# Patient Record
Sex: Female | Born: 1980 | Race: White | Hispanic: No | Smoking: Current every day smoker
Health system: Southern US, Community
[De-identification: ages and names within clinical notes are randomized; demographics above are authoritative.]

## PROBLEM LIST (undated history)

## (undated) DIAGNOSIS — R51 Headache: Secondary | ICD-10-CM

## (undated) DIAGNOSIS — F419 Anxiety disorder, unspecified: Secondary | ICD-10-CM

## (undated) DIAGNOSIS — M797 Fibromyalgia: Secondary | ICD-10-CM

## (undated) DIAGNOSIS — R519 Headache, unspecified: Secondary | ICD-10-CM

## (undated) DIAGNOSIS — Z87442 Personal history of urinary calculi: Secondary | ICD-10-CM

## (undated) DIAGNOSIS — I10 Essential (primary) hypertension: Secondary | ICD-10-CM

## (undated) DIAGNOSIS — E119 Type 2 diabetes mellitus without complications: Secondary | ICD-10-CM

## (undated) DIAGNOSIS — K219 Gastro-esophageal reflux disease without esophagitis: Secondary | ICD-10-CM

## (undated) DIAGNOSIS — Z8709 Personal history of other diseases of the respiratory system: Secondary | ICD-10-CM

## (undated) DIAGNOSIS — E785 Hyperlipidemia, unspecified: Secondary | ICD-10-CM

## (undated) HISTORY — PX: WISDOM TOOTH EXTRACTION: SHX21

---

## 2001-08-27 ENCOUNTER — Emergency Department (HOSPITAL_COMMUNITY): Admission: EM | Admit: 2001-08-27 | Discharge: 2001-08-28 | Payer: Self-pay | Admitting: *Deleted

## 2001-12-15 ENCOUNTER — Emergency Department (HOSPITAL_COMMUNITY): Admission: EM | Admit: 2001-12-15 | Discharge: 2001-12-15 | Payer: Self-pay | Admitting: Emergency Medicine

## 2002-03-28 ENCOUNTER — Ambulatory Visit (HOSPITAL_COMMUNITY): Admission: RE | Admit: 2002-03-28 | Discharge: 2002-03-28 | Payer: Self-pay | Admitting: *Deleted

## 2002-03-28 ENCOUNTER — Encounter: Payer: Self-pay | Admitting: *Deleted

## 2002-05-12 ENCOUNTER — Ambulatory Visit (HOSPITAL_COMMUNITY): Admission: AD | Admit: 2002-05-12 | Discharge: 2002-05-12 | Payer: Self-pay | Admitting: *Deleted

## 2002-06-13 ENCOUNTER — Encounter: Payer: Self-pay | Admitting: *Deleted

## 2002-06-13 ENCOUNTER — Ambulatory Visit (HOSPITAL_COMMUNITY): Admission: RE | Admit: 2002-06-13 | Discharge: 2002-06-13 | Payer: Self-pay | Admitting: *Deleted

## 2002-07-01 ENCOUNTER — Ambulatory Visit (HOSPITAL_COMMUNITY): Admission: AD | Admit: 2002-07-01 | Discharge: 2002-07-01 | Payer: Self-pay | Admitting: *Deleted

## 2002-07-16 ENCOUNTER — Ambulatory Visit (HOSPITAL_COMMUNITY): Admission: AD | Admit: 2002-07-16 | Discharge: 2002-07-16 | Payer: Self-pay | Admitting: *Deleted

## 2002-07-21 ENCOUNTER — Ambulatory Visit (HOSPITAL_COMMUNITY): Admission: RE | Admit: 2002-07-21 | Discharge: 2002-07-21 | Payer: Self-pay | Admitting: *Deleted

## 2002-07-25 ENCOUNTER — Inpatient Hospital Stay (HOSPITAL_COMMUNITY): Admission: AD | Admit: 2002-07-25 | Discharge: 2002-07-27 | Payer: Self-pay | Admitting: *Deleted

## 2002-08-30 ENCOUNTER — Ambulatory Visit (HOSPITAL_COMMUNITY): Admission: RE | Admit: 2002-08-30 | Discharge: 2002-08-30 | Payer: Self-pay | Admitting: *Deleted

## 2002-11-05 ENCOUNTER — Other Ambulatory Visit: Admission: RE | Admit: 2002-11-05 | Discharge: 2002-11-05 | Payer: Self-pay | Admitting: *Deleted

## 2003-07-17 ENCOUNTER — Ambulatory Visit (HOSPITAL_COMMUNITY): Admission: RE | Admit: 2003-07-17 | Discharge: 2003-07-17 | Payer: Self-pay | Admitting: Family Medicine

## 2003-07-17 ENCOUNTER — Encounter: Payer: Self-pay | Admitting: Family Medicine

## 2004-01-22 ENCOUNTER — Ambulatory Visit (HOSPITAL_COMMUNITY): Admission: RE | Admit: 2004-01-22 | Discharge: 2004-01-22 | Payer: Self-pay | Admitting: *Deleted

## 2004-02-09 ENCOUNTER — Other Ambulatory Visit: Admission: RE | Admit: 2004-02-09 | Discharge: 2004-02-09 | Payer: Self-pay | Admitting: *Deleted

## 2004-07-02 ENCOUNTER — Ambulatory Visit (HOSPITAL_COMMUNITY): Admission: RE | Admit: 2004-07-02 | Discharge: 2004-07-02 | Payer: Self-pay | Admitting: Family Medicine

## 2004-11-22 ENCOUNTER — Emergency Department (HOSPITAL_COMMUNITY): Admission: EM | Admit: 2004-11-22 | Discharge: 2004-11-22 | Payer: Self-pay | Admitting: Emergency Medicine

## 2005-03-07 ENCOUNTER — Emergency Department (HOSPITAL_COMMUNITY): Admission: EM | Admit: 2005-03-07 | Discharge: 2005-03-07 | Payer: Self-pay | Admitting: Emergency Medicine

## 2005-05-05 ENCOUNTER — Emergency Department (HOSPITAL_COMMUNITY): Admission: EM | Admit: 2005-05-05 | Discharge: 2005-05-05 | Payer: Self-pay | Admitting: Emergency Medicine

## 2005-06-06 ENCOUNTER — Emergency Department (HOSPITAL_COMMUNITY): Admission: EM | Admit: 2005-06-06 | Discharge: 2005-06-06 | Payer: Self-pay | Admitting: Emergency Medicine

## 2005-09-25 ENCOUNTER — Emergency Department (HOSPITAL_COMMUNITY): Admission: EM | Admit: 2005-09-25 | Discharge: 2005-09-26 | Payer: Self-pay | Admitting: *Deleted

## 2006-06-13 ENCOUNTER — Emergency Department (HOSPITAL_COMMUNITY): Admission: EM | Admit: 2006-06-13 | Discharge: 2006-06-13 | Payer: Self-pay | Admitting: Emergency Medicine

## 2006-06-26 ENCOUNTER — Emergency Department (HOSPITAL_COMMUNITY): Admission: EM | Admit: 2006-06-26 | Discharge: 2006-06-27 | Payer: Self-pay | Admitting: Emergency Medicine

## 2006-09-05 ENCOUNTER — Emergency Department (HOSPITAL_COMMUNITY): Admission: EM | Admit: 2006-09-05 | Discharge: 2006-09-05 | Payer: Self-pay | Admitting: Emergency Medicine

## 2006-10-08 ENCOUNTER — Emergency Department (HOSPITAL_COMMUNITY): Admission: EM | Admit: 2006-10-08 | Discharge: 2006-10-08 | Payer: Self-pay | Admitting: Emergency Medicine

## 2006-10-29 ENCOUNTER — Emergency Department (HOSPITAL_COMMUNITY): Admission: EM | Admit: 2006-10-29 | Discharge: 2006-10-29 | Payer: Self-pay | Admitting: Emergency Medicine

## 2007-06-15 ENCOUNTER — Emergency Department (HOSPITAL_COMMUNITY): Admission: EM | Admit: 2007-06-15 | Discharge: 2007-06-16 | Payer: Self-pay | Admitting: *Deleted

## 2007-07-27 ENCOUNTER — Ambulatory Visit (HOSPITAL_COMMUNITY): Admission: RE | Admit: 2007-07-27 | Discharge: 2007-07-27 | Payer: Self-pay | Admitting: Family Medicine

## 2008-01-17 ENCOUNTER — Ambulatory Visit: Payer: Self-pay | Admitting: Orthopedic Surgery

## 2008-01-17 DIAGNOSIS — M549 Dorsalgia, unspecified: Secondary | ICD-10-CM | POA: Insufficient documentation

## 2008-01-18 ENCOUNTER — Telehealth: Payer: Self-pay | Admitting: Orthopedic Surgery

## 2008-02-06 ENCOUNTER — Emergency Department (HOSPITAL_COMMUNITY): Admission: EM | Admit: 2008-02-06 | Discharge: 2008-02-06 | Payer: Self-pay | Admitting: Emergency Medicine

## 2008-03-17 ENCOUNTER — Emergency Department (HOSPITAL_COMMUNITY): Admission: EM | Admit: 2008-03-17 | Discharge: 2008-03-17 | Payer: Self-pay | Admitting: Emergency Medicine

## 2008-04-18 ENCOUNTER — Emergency Department (HOSPITAL_COMMUNITY): Admission: EM | Admit: 2008-04-18 | Discharge: 2008-04-18 | Payer: Self-pay | Admitting: Emergency Medicine

## 2008-06-10 ENCOUNTER — Ambulatory Visit (HOSPITAL_COMMUNITY): Admission: RE | Admit: 2008-06-10 | Discharge: 2008-06-10 | Payer: Self-pay | Admitting: Family Medicine

## 2008-06-13 ENCOUNTER — Encounter (INDEPENDENT_AMBULATORY_CARE_PROVIDER_SITE_OTHER): Payer: Self-pay | Admitting: Family Medicine

## 2008-06-13 ENCOUNTER — Ambulatory Visit (HOSPITAL_COMMUNITY): Admission: RE | Admit: 2008-06-13 | Discharge: 2008-06-13 | Payer: Self-pay | Admitting: Family Medicine

## 2008-07-05 ENCOUNTER — Emergency Department (HOSPITAL_COMMUNITY): Admission: EM | Admit: 2008-07-05 | Discharge: 2008-07-05 | Payer: Self-pay | Admitting: Emergency Medicine

## 2008-08-12 ENCOUNTER — Emergency Department (HOSPITAL_COMMUNITY): Admission: EM | Admit: 2008-08-12 | Discharge: 2008-08-12 | Payer: Self-pay | Admitting: Emergency Medicine

## 2008-09-01 ENCOUNTER — Emergency Department (HOSPITAL_COMMUNITY): Admission: EM | Admit: 2008-09-01 | Discharge: 2008-09-01 | Payer: Self-pay | Admitting: Emergency Medicine

## 2008-09-25 ENCOUNTER — Emergency Department (HOSPITAL_COMMUNITY): Admission: EM | Admit: 2008-09-25 | Discharge: 2008-09-25 | Payer: Self-pay | Admitting: Emergency Medicine

## 2008-10-10 ENCOUNTER — Emergency Department (HOSPITAL_COMMUNITY): Admission: EM | Admit: 2008-10-10 | Discharge: 2008-10-10 | Payer: Self-pay | Admitting: Emergency Medicine

## 2009-01-27 ENCOUNTER — Emergency Department (HOSPITAL_COMMUNITY): Admission: EM | Admit: 2009-01-27 | Discharge: 2009-01-27 | Payer: Self-pay | Admitting: Emergency Medicine

## 2009-01-30 ENCOUNTER — Emergency Department (HOSPITAL_COMMUNITY): Admission: EM | Admit: 2009-01-30 | Discharge: 2009-01-30 | Payer: Self-pay | Admitting: Emergency Medicine

## 2009-05-19 ENCOUNTER — Emergency Department (HOSPITAL_COMMUNITY): Admission: EM | Admit: 2009-05-19 | Discharge: 2009-05-19 | Payer: Self-pay | Admitting: Emergency Medicine

## 2009-09-23 ENCOUNTER — Emergency Department (HOSPITAL_COMMUNITY): Admission: EM | Admit: 2009-09-23 | Discharge: 2009-09-23 | Payer: Self-pay | Admitting: Emergency Medicine

## 2010-02-02 ENCOUNTER — Emergency Department (HOSPITAL_COMMUNITY): Admission: EM | Admit: 2010-02-02 | Discharge: 2010-02-02 | Payer: Self-pay | Admitting: Emergency Medicine

## 2010-03-11 ENCOUNTER — Emergency Department (HOSPITAL_COMMUNITY): Admission: EM | Admit: 2010-03-11 | Discharge: 2010-03-11 | Payer: Self-pay | Admitting: Emergency Medicine

## 2010-05-02 ENCOUNTER — Emergency Department (HOSPITAL_COMMUNITY): Admission: EM | Admit: 2010-05-02 | Discharge: 2010-05-02 | Payer: Self-pay | Admitting: Emergency Medicine

## 2010-06-10 ENCOUNTER — Encounter: Payer: Self-pay | Admitting: Orthopedic Surgery

## 2010-06-29 ENCOUNTER — Emergency Department (HOSPITAL_COMMUNITY): Admission: EM | Admit: 2010-06-29 | Discharge: 2010-06-30 | Payer: Self-pay | Admitting: Emergency Medicine

## 2010-07-07 ENCOUNTER — Encounter: Payer: Self-pay | Admitting: Orthopedic Surgery

## 2010-07-07 ENCOUNTER — Telehealth: Payer: Self-pay | Admitting: Orthopedic Surgery

## 2010-09-15 ENCOUNTER — Emergency Department (HOSPITAL_COMMUNITY): Admission: EM | Admit: 2010-09-15 | Discharge: 2010-09-16 | Payer: Self-pay | Admitting: Emergency Medicine

## 2010-09-18 ENCOUNTER — Emergency Department (HOSPITAL_COMMUNITY): Admission: EM | Admit: 2010-09-18 | Discharge: 2010-09-18 | Payer: Self-pay | Admitting: Emergency Medicine

## 2010-10-17 ENCOUNTER — Emergency Department (HOSPITAL_COMMUNITY): Admission: EM | Admit: 2010-10-17 | Discharge: 2010-10-17 | Payer: Self-pay | Admitting: Emergency Medicine

## 2010-11-15 ENCOUNTER — Emergency Department (HOSPITAL_COMMUNITY)
Admission: EM | Admit: 2010-11-15 | Discharge: 2010-11-15 | Payer: Self-pay | Source: Home / Self Care | Admitting: Emergency Medicine

## 2010-11-27 ENCOUNTER — Emergency Department (HOSPITAL_COMMUNITY)
Admission: EM | Admit: 2010-11-27 | Discharge: 2010-11-27 | Payer: Self-pay | Source: Home / Self Care | Admitting: Emergency Medicine

## 2010-12-19 ENCOUNTER — Encounter: Payer: Self-pay | Admitting: Orthopedic Surgery

## 2010-12-26 ENCOUNTER — Emergency Department (HOSPITAL_COMMUNITY)
Admission: EM | Admit: 2010-12-26 | Discharge: 2010-12-26 | Payer: Self-pay | Source: Home / Self Care | Admitting: Emergency Medicine

## 2010-12-28 NOTE — Letter (Signed)
Summary: [facility] Referral No Show  [facility]  [address]  [place], [place] [address]   Phone: [phone]  Fax: [phone]    06/10/10   [facility]  ATTN:  Referrals Phone [phone] Fax   [phone]  Re:    Caitlin Mcguire DOB:    November 11, 1981    The above named patient was a no show for the scheduled appointment:  Date:  7/07;/11 Time:  8:45 am  Thank you for your kind referral.  Please feel free to contact our office if we can be of further service.  Sincerely,   [facility] Office of Dr. Terrance Mass, MD

## 2010-12-28 NOTE — Letter (Signed)
Summary: Letter to PCP office [facility] Dept Referrals  [facility]  [address]  [place], [place] [address]   Phone: [phone]  Fax: [phone]      07/07/2010  [facility] Department Attn:  Referral Department Ph:  [phone] Fax: [phone]  RE: Caitlin Mcguire [address] [place], [place]  [address]   Regarding the re-scheduled appointment date of 07/07/10 at 11:45am for Caitlin Mcguire, Dr Romeo Apple had reviewed the medical records and has recommended that she hold on the appointment until the bone scan and the physical therapy that he recommended at her last visit in 2009 be followed.  We have advised the patient of this information and will ask that she follow up with your office for these to be ordered there, as insurance will not pre-authorize from our last office notes from 2009.  Please contact our office if any questions or if we may be of further assistance.  Once patient has had the bone scan and the physical therapy, we would be happy to schedule another appointment.  Sincerely,   [facility] Terrance Mass, MD

## 2010-12-28 NOTE — Letter (Signed)
Summary: *Orthopedic No Show Letter  [facility]  [address]  [place], [place] [address]   Phone: [phone]  Fax: [phone]      06/10/2010   Da Schnitzler [address] [place], [place] [address]    Dear Ms. Ganci,   Our records indicate that you missed your scheduled appointment with Dr. Beaulah Corin on 06/03/10.  Please contact this office to reschedule your appointment as soon as possible.  It is important that you keep your scheduled appointments with your physician, so we can provide you the best care possible.        Sincerely,    Dr. Terrance Mass, MD [facility] Phone [phone]

## 2010-12-28 NOTE — Progress Notes (Signed)
Summary: Call to patient,hold on appt until bone scan R/s + PT  ---- Converted from flag ---- ---- 07/07/2010 8:25 AM, Fuller Canada MD wrote: please call patient cancel appointment the patient did not go for the bone scan and did not have therapy after I saw her the last time if she wants to go ahead and do that and she still has problems after that therapy then we will be happy to see her otherwise no appointment  Also if she wants to do she can have the bone scan rescheduled  07/07/10 Called patient at [facility] # [phone]; left msg 8:47Am.  Patient ret'd call and I have advised as above.    She would like to re-schedule the bone scan and will proceed with physical therapy.  New orders needed.  ------------------------------  Phone Note Call from Patient   Summary of Call: patient will have to be seen somewhere else, she has not been seen in over 2 years and the dr does not want to see her, for the precert for the bone scan she has to be seen by someone recent Initial call taken by: Chasity Tereasa Coop,  July 07, 2010 9:15 AM  Follow-up for Phone Call        I am following up with the referral coordinator at [facility], as they ref'd patient here, as they would need to order the testing and the therapy. Faxed notes to Health dept [phone] w/this request. Follow-up by: Cammie Sickle,  July 07, 2010 2:48 PM

## 2011-01-20 ENCOUNTER — Emergency Department (HOSPITAL_COMMUNITY)
Admission: EM | Admit: 2011-01-20 | Discharge: 2011-01-20 | Disposition: A | Discharge status: Home / Self Care | Payer: Self-pay | Attending: Emergency Medicine | Admitting: Emergency Medicine

## 2011-01-20 DIAGNOSIS — L0501 Pilonidal cyst with abscess: Secondary | ICD-10-CM | POA: Insufficient documentation

## 2011-02-07 LAB — URINE CULTURE: Colony Count: 80000

## 2011-02-07 LAB — URINALYSIS, ROUTINE W REFLEX MICROSCOPIC
Ketones, ur: NEGATIVE mg/dL
Nitrite: NEGATIVE
Protein, ur: NEGATIVE mg/dL
Specific Gravity, Urine: 1.02 (ref 1.005–1.030)

## 2011-02-07 LAB — URINE MICROSCOPIC-ADD ON

## 2011-03-10 LAB — URINALYSIS, ROUTINE W REFLEX MICROSCOPIC
Bilirubin Urine: NEGATIVE
Glucose, UA: NEGATIVE mg/dL
Hgb urine dipstick: NEGATIVE
Protein, ur: NEGATIVE mg/dL
Specific Gravity, Urine: 1.03 (ref 1.005–1.030)
pH: 5.5 (ref 5.0–8.0)

## 2011-04-15 NOTE — H&P (Signed)
NAME:  Caitlin Mcguire, ORD NO.:  [id number]   MEDICAL RECORD NO.:  [id number]                   PATIENT TYPE:   LOCATION:                                       FACILITY:   PHYSICIAN:  Roylene Reason. Lisette Grinder, M.D.             DATE OF BIRTH:   DATE OF ADMISSION:  07/25/2002  DATE OF DISCHARGE:                                HISTORY & PHYSICAL   HISTORY OF PRESENT ILLNESS:  A 30 year old gravida 3, para 1 at [redacted] weeks  gestation admitted for induction of labor.  Prenatal course uncomplicated.  The patient is known to have an elevated one-hour GTT of 165; however, the  patient had drank juice during that day.  She has no risk factors for  gestational diabetes.  During her previous pregnancy, one-hour GTT was  normal.  The patient has had no glycosuria and no excessive weight gain  during the pregnancy.  The patient is noted to be A+ blood type.  She has  had serial ultrasounds that documented adequate fetal growth and normal  anatomic survey.  She has an absolutely certain last menstrual period which  places her at [redacted] weeks gestation and this is supported by a 19-week  ultrasound with a plus or minus 10-day window of accuracy.   PAST MEDICAL HISTORY:  November 1998:  Vacuum extraction, 7 pound 4 ounce  female infant, [redacted] weeks gestation, episiotomy plus epidural.  December 2000:  Spontaneous AB.   ALLERGIES:  PENICILLIN.   CURRENT MEDICATIONS:  1. Prenatal vitamins.  2. Lortab p.r.n. for back pain.   PHYSICAL EXAMINATION:  VITAL SIGNS:  Height 5 feet 2 inches, prepregnancy  weight 203 pounds, blood pressure 108/68, pulse 80, respiratory rate 20.  HEENT:  Normal.  LUNGS:  Clear.  CARDIOVASCULAR:  Regular rate and rhythm.  ABDOMEN:  Soft and nontender.  Vertex presentation.  No surgical scars.  A  40-cm fundal height.  EXTREMITIES:  Normal.  PELVIC:  Normal external genitalia.  Pelvis clinically adequate.  Cervix 3,  70, 0, vertex, intact bulging  membranes.   ASSESSMENT:  A 38-week intrauterine pregnancy, significant complaints of  back pain during this pregnancy, particularly worsening at this time.  The  patient is known to be having irregular uterine contractions at this time  and a very favorable cervix.  Thus, the patient has been admitted for  induction of labor on July 25, 2002.  Pediatrician had planned on  utilizing Dr. Gabriel Rung.  She will be utilizing pediatrician on-call.  She is  undecided regarding bottle or breastfeeding.  At some point in the near  future, she would desire permanent sterilization, but this will not be done  during this hospitalization, as her Medicaid 30-day waiting papers have only  been signed on July 23, 2002.  Donald P. Lisette Grinder, M.D.   DPC/MEDQ  D:  07/23/2002  T:  07/24/2002  Job:  [id number]

## 2011-04-15 NOTE — Op Note (Signed)
   NAME:  Caitlin Mcguire, Caitlin Mcguire                          ACCOUNT NO.:  [id number]   MEDICAL RECORD NO.:  [id number]                   PATIENT TYPE:  INP   LOCATION:  A427                                 FACILITY:  [facility]   PHYSICIAN:  Roylene Reason. Lisette Grinder, M.D.             DATE OF BIRTH:  [birth year]   DATE OF PROCEDURE:  DATE OF DISCHARGE:  07/27/2002                                 OPERATIVE REPORT   PROCEDURE:  Placement of continuous lumbar epidural analgesia at the L3-4  interspace performed by Dr. Roylene Reason. Lisette Grinder.   COMPLICATIONS:  None.   SPECIMENS:  None.   SUMMARY:  Appropriate informed consent was obtained. Continuous electronic  fetal monitoring was performed. The patient was placed in the seated  position at which time bony landmarks were identified, the L3-4 interspace  was chosen. The patient's back was sterilely prepped utilizing an epidural  kit, 5 cc 1% lidocaine injected at the midline of the L3-4 interspace just  to raise the small skin wheal. The 17 gauge Tuohy-Schliff needle is then  utilized with loss of resistance in an air filled glass syringe to identify  entry into the epidural space on the first attempt without difficulty.  Initial test dose of 5 cc 1.5% lidocaine plus epinephrine was injected  through the epidural needle with no signs of CSF or intravascular injection  obtained, thus the catheter is inserted to a depth of 4 cm, epidural needle  is removed, aspiration test is negative. The epidural catheter is then  inserted to a depth of 4 cm, epidural needle is removed, aspiration test is  negative. A second test dose of 2 cc 1.5% lidocaine plus epinephrine is  injected through the epidural catheter. No signs of CSF or intravascular  injection obtained. The patient is having tingling in the feet consistent  with properly setting of epidural block, thus the catheter secured into  place. The patient is connected to the infusion pump containing a standard  mixture.  She will be treated with a bolus of 10 cc followed thereafter by  continuous infusion rate of 14 cc/hour. The patient is then returned to the  left lateral supine position at which time she has heaviness in both legs  consistent with a properly setting up epidural block. The patient to be  maintained on the infusion pump at 14 cc/hour. There were no complications.  Fetal heart rate remains reassuring. Blood pressure remains stable.                                               Donald P. Lisette Grinder, M.D.    DPC/MEDQ  D:  07/26/2002  T:  07/27/2002  Job:  [id number]

## 2011-04-15 NOTE — Discharge Summary (Signed)
   NAME:  TISHANNA, DUNFORD                          ACCOUNT NO.:  [id number]   MEDICAL RECORD NO.:  [id number]                   PATIENT TYPE:  AMB   LOCATION:  DAY                                  FACILITY:  [facility]   PHYSICIAN:  Roylene Reason. Lisette Grinder, M.D.             DATE OF BIRTH:  December 24, 1980   DATE OF ADMISSION:  08/30/2002  DATE OF DISCHARGE:  08/30/2002                                 DISCHARGE SUMMARY   HOSPITAL COURSE:  The patient was given a copy of the standardized  discharge instructions to be discharged home on date of service.   Pertinent laboratory studies: A hCG negative, hemoglobin 13.1, hematocrit  38.8, a white count of 6.7.   DISCHARGE MEDICATIONS:  Lortab 10/500 #30 with no refills.                                               Donald P. Lisette Grinder, M.D.    DPC/MEDQ  D:  08/30/2002  T:  09/03/2002  Job:  [id number]

## 2011-04-15 NOTE — Op Note (Signed)
NAME:  Caitlin Mcguire, Caitlin Mcguire NO.:  [id number]   MEDICAL RECORD NO.:  [id number]                  PATIENT TYPE:   LOCATION:                                       FACILITY:   PHYSICIAN:  Roylene Reason. Lisette Grinder, M.D.             DATE OF BIRTH:   DATE OF PROCEDURE:  DATE OF DISCHARGE:                                 OPERATIVE REPORT   PREOPERATIVE DIAGNOSES:  38 week intrauterine pregnancy, dysfunctional labor  pattern requiring augmentation of labor.   POSTOPERATIVE DIAGNOSES:  38 week intrauterine pregnancy, dysfunctional  labor pattern requiring augmentation of labor.   PROCEDURE PERFORMED:  1. Continuous lumbar epidural analgesia.  2. Spontaneous cystovaginal delivery of 6 pound 14 ounce female infant over an     intact perineum, delivery performed by Dr. Roylene Reason. Lisette Grinder.   ANALGESIA FOR DELIVERY:  Epidural which functioned very well.   COMPLICATIONS:  None.   SPECIMENS:  Arterial cord gas and cord blood to pathology. The placenta is  examined and noted to be apparently intact with a three vessel umbilical  cord.   SUMMARY:  The patient admitted on July 25, 2002 for induction of labor.  She was noted to have irregular uterine contractions upon admission, thus  amniotomy was performed with findings of clear amniotic fluid. A fetal scalp  electrode is placed. The patient had a dysfunctional labor pattern secondary  to inadequate frequency and intensity of uterine contractions so Pitocin  augmentation was initiated. With onset of active labor and uncomfortable  uterine contractions, the patient requested an epidural which was placed  without complications by Dr. Roylene Reason. Lisette Grinder and this functioned very well  throughout the remainder of the labor course with excellent bilateral block  in place. The patient thereafter progressed normally along the labor curve,  had complete dilatation and significant urge to push. She was placed in the  dorsal  lithotomy position, prepped and draped in the usual sterile manner.  The Foley catheter was removed. The patient pushed well during a short  second stage of labor to deliver in a direct in a OA position over the  intact perineum. Mouth and nares were bulb suctioned of clear amniotic  fluid. Renewed expulsive efforts resulted in spontaneous rotation to right  anterior shoulder position. Expulsive efforts plus gentle abdominal traction  resulted in delivery of this shoulder down the pubic symphysis without  difficulty. The umbilical cord is then milked toward the infant, cord is  doubly clamped and cut, infant is known to have a spontaneous and vigorous  breathing cry. He was taken to the nursery for immediate assessment after  her cord gas and cord blood are then obtained from the placenta. Gentle  traction on the umbilical cord results in separation which upon examination  appears to be an intact three vessel placenta. Excellent uterine tone is  achieved following delivery. The patient is treated with IV Pitocin  solution. Following delivery, she is taken out of dorsal lithotomy position,  rolled to her side at which time the epidural catheter is removed, the blue  tip noted to be intact and shown to the patient.                                               Donald P. Lisette Grinder, M.D.    DPC/MEDQ  D:  07/26/2002  T:  07/27/2002  Job:  [id number]

## 2011-04-15 NOTE — H&P (Signed)
   NAME:  Caitlin Mcguire, Caitlin Mcguire                          ACCOUNT NO.:  [id number]   MEDICAL RECORD NO.:  [id number]                   PATIENT TYPE:  AMB   LOCATION:  DAY                                  FACILITY:  [facility]   PHYSICIAN:  Roylene Reason. Lisette Grinder, M.D.             DATE OF BIRTH:  1981-06-08   DATE OF ADMISSION:  08/30/2002  DATE OF DISCHARGE:                                HISTORY & PHYSICAL   HISTORY OF PRESENT ILLNESS:  The patient is a 30 year old gravida 2, para 2,  postpartum OB July 25, 2002, who desires permanent sterilization.  She  understands the inherent 2% failure rate with the procedure and also  understands that it is to be considered a permanent and irreversible  procedure.  The patient likewise knows that there are other reversible forms  of birth control available but at this time elects to proceed with permanent  and irreversible sterilization.   PAST MEDICAL HISTORY:  Chronic back pain.  She has taken Lortab on a p.r.n.  basis for recurrent back pain which was exacerbated by pregnancy.  Two prior  vaginal deliveries without complications.   ALLERGIES:  She states allergy to PENICILLIN, which gives her swelling and  fever.   REGULAR MEDICATIONS:  Prenatal vitamins.   PHYSICAL EXAMINATION:  VITAL SIGNS:  Height 5 feet 2 inches, weight 172.  125/78, 66.  HEENT:  Negative.  No adenopathy.  NECK:  Supple.  Thyroid is not palpable.  LUNGS:  Clear.  CARDIOVASCULAR:  Regular rate and rhythm.  ABDOMEN:  Soft and nontender.  No surgical scars are identified.  No  abdominal mass appreciated.  PELVIC:  Excellent AP support.  Uterus noted to be normal size with no  palpable masses.  Ovaries noted to be palpably normal.   ASSESSMENT:  The patient is a 30 year old, after appropriate counseling, who  desires elective permanent and irreversible sterilization to be performed on  August 30, 2002, utilizing _____ with cautery.     Donald P. Lisette Grinder, M.D.    DPC/MEDQ  D:  08/30/2002  T:  08/31/2002  Job:  [id number]

## 2011-04-15 NOTE — Discharge Summary (Signed)
   NAME:  Caitlin Mcguire, LACOMBE                          ACCOUNT NO.:  [id number]   MEDICAL RECORD NO.:  [id number]                   PATIENT TYPE:  INP   LOCATION:  A427                                 FACILITY:  [facility]   PHYSICIAN:  Roylene Reason. Lisette Grinder, M.D.             DATE OF BIRTH:  05/24/1981   DATE OF ADMISSION:  07/25/2002  DATE OF DISCHARGE:  07/27/2002                                 DISCHARGE SUMMARY   DIAGNOSES:  1. A 38-week intrauterine pregnancy, delivered.  2. Dysfunctional labor requiring augmentation.   PROCEDURE:  1. Continuous lumbar epidural analgesia August 28.  2. Spontaneous assisted vaginal delivery August 28, female infant.   PERTINENT LABORATORY STUDIES:  Admission hemoglobin and hematocrit 11.6/33.3  with a white count of 12.2.  A+ blood type.   DISCHARGE MEDICATIONS:  Lortab #30 for postpartum cramping, as well as  chronic back pain.   DISCHARGE INSTRUCTIONS:  The patient is given a copy of the standard  discharge instructions.  She is utilizing pediatrician on-call.  A+ blood  type.  The patient would desire permanent sterilization at time of followup.  She will be seen in four weeks' time.   HOSPITAL COURSE:  See previous dictations.  The patient delivered  uneventfully utilizing epidural analgesic.  Postpartum, she did very well.  She was seen by myself on rounds on July 26, 2002, at which time she was  noted to have stable vital signs, excellent uterine tone, no excessive  vaginal bleeding, no complaints of back pain.  Discomfort is easily relieved  with p.o. Lortab.  I discussed with the patient the circumcision and she was  made aware of the noncoverage of circumcision procedure by Medicaid and thus  she would be responsible at discharge.  The patient desires infant  circumcision; however, she had made no arrangements, nor had she met her  financial obligation to have circumcision performed, thus it is not to be  performed during this hospitalization.   The patient is to be discharged to  home on July 27, 2002 in my absence by Dr. Duane Lope, according to cross-  coverage arrangement.                                                 Donald P. Lisette Grinder, M.D.    DPC/MEDQ  D:  07/26/2002  T:  07/26/2002  Job:  [id number]

## 2011-04-15 NOTE — Discharge Summary (Signed)
NAME:  Caitlin Mcguire, Caitlin Mcguire                          ACCOUNT NO.:  [id number]   MEDICAL RECORD NO.:  [id number]                   PATIENT TYPE:  AMB   LOCATION:  DAY                                  FACILITY:  [facility]   PHYSICIAN:  Roylene Reason. Lisette Grinder, M.D.             DATE OF BIRTH:  [birth year]   DATE OF ADMISSION:  08/30/2002  DATE OF DISCHARGE:  08/30/2002                                 DISCHARGE SUMMARY   PREOPERATIVE DIAGNOSIS:  Desires sterilization.   POSTOPERATIVE DIAGNOSIS:  Desires sterilization.   PROCEDURE:  Laparoscopic tubal ligation using bipolar cautery.   SURGEON:  Roylene Reason. Lisette Grinder, M.D.   COMPLICATIONS:  None.   SPECIMENS:  None.   ANESTHESIA:  General endotracheal   OPERATIVE FINDINGS:  The patient was taken to the operating room and vital  signs were stable.  The patient underwent uncomplicated induction of general  endotracheal anesthesia after which time she was placed in the low lithotomy  position and prepped and draped in the usual sterile manner.  Sterile  speculum examination reveals no cystocele.  Urethra was well supported.  There was minimal descent of the cervix and uterus with traction.  Anterior  lip of the cervix was grasped with a single-tooth tenaculum.  A Hulka  intrauterine manipulator was then passed through the endocervical os and  used to grab the cervix at 12 o'clock.  The speculum was then removed.  I  then changed to sterile gloves.   Then, standing at the patient's left side, a 1 cm vertical incision was made  just inferior to the umbilicus.  Likewise a vertical incision made under  direct visualization.  The abdominal wall was then elevated.  A Veress  needle was then placed subumbilically through the subumbilical incision.  This was done atraumatically.  Drop test confirms a proper intraperitoneal  placement.  Pneumoperitoneum is then created by insufflating 3 L of CO2 gas  at a filling pressure of less than 10 mmHg.  After assurance  of adequate  pneumoperitoneum the Veress needle is removed and the abdominal wall is then  elevated.  The 10 mm clear plastic trocar and sleeve were then inserted  through the subumbilical incision while angling towards the hollow of the  sacrum and elevating the abdominal wall.  This was done atraumatically.  Confirmation is made after the scope is inserted.  There is proper  intraperitoneal placement and no apparent bowel or vascular injury.   A 5 mm trocar and sleeve were then inserted suprapubically under direct  visualization.  Each of the fallopian tubes are identified.  The uterus  noted to be normal in anatomy with normal size, shape, and consistency of  the uterus.  No evidence of any adhesive disease, endometriosis, or cystic  lesion.  Bipolar cauterization is then performed on each tube by performing  a triple cauterization technique, the most proximal being 1 cm from the  tubal uterine junction and all 3 burns being checked for continuity with the  previous and continuing until LED lights plateaued at 2. This resulted in  extensive tubular destruction bilaterally.  There were no complications.  Gas was allowed to escape.  Instruments removed.   The incision sites closed utilizing #0 Vicryl in deep interrupted fashion  through the fascia; 2-0 Nylon suture was then placed through the  subcutaneous incision.  The patient tolerated the procedure very well.  The  Hulka tenaculum was removed.  The patient was reversed of anesthesia and  taken to the recovery room in stable condition at which time the patient's  awaiting family was then notified of the results.                                               Donald P. Lisette Grinder, M.D.    DPC/MEDQ  D:  08/30/2002  T:  09/03/2002  Job:  [id number]

## 2011-04-15 NOTE — Discharge Summary (Signed)
   NAME:  Caitlin Mcguire, Caitlin Mcguire                          ACCOUNT NO.:  [id number]   MEDICAL RECORD NO.:  [id number]                   PATIENT TYPE:  OIB   LOCATION:  LDR1                                 FACILITY:  [facility]   PHYSICIAN:  Roylene Reason. Lisette Grinder, M.D.             DATE OF BIRTH:  11/19/1981   DATE OF ADMISSION:  07/01/2002  DATE OF DISCHARGE:  07/01/2002                                 DISCHARGE SUMMARY   HISTORY OF PRESENT ILLNESS:  This is a 30 year old gravida 3, para 1 at [redacted]  weeks gestation who presents to [facility] complaining of leaking  fluid x2 days' duration.  The patient states it has been a sticky discharge  x2 days' duration with some back pain which has been steady in nature.  She  does have occasional uterine contractions by history which she states have  increased today.  Prenatal course complicated by first trimester hyperemesis  only.   PAST MEDICAL HISTORY:  She has one prior vaginal delivery by vacuum  extraction, one prior spontaneous AB.   PHYSICAL EXAMINATION:  GENERAL:  Pale white female in no acute distress.  VITAL SIGNS:  Temperature 98.4, pulse 126, respirations 20, blood pressure  134/72.  ABDOMEN:  Soft, nontender.  Fundal height 35.  Vertex presentation by  Leopold's maneuvers.  No surgical scars are identified.  PELVIC:  Normal external genitalia.  Some significant erythema is  identified.  Sterile speculum examination is performed which reveals a  clumpy vaginal discharge, significant intravaginal erythema consistent with  yeast vaginitis.  Pertinently, there is noted to be no leakage of fluid.  Cervix is noted to be fingertip, 3 cm long, very high presenting station.  External fetal monitor reveals baseline 140s with good fetal heart rate  accelerations noted.  Only occasional uterine contractions are noted.  Nonstress test is interpreted as reactive.   LABORATORY STUDIES:  Urinalysis:  Few epithelial cells, 7-10 white cells,  positive for  nitrites with urine total protein of 30.   ASSESSMENT:  1. A 33 week intrauterine pregnancy with threatened preterm labor, false     labor.  2. Yeast vaginitis.  3. Urinary tract infection.  The patient is treated with Macrodantin 100 mg     p.o. b.i.d. x7 days as well as Gynazole 1 intravaginal treatment of     vaginitis.  Nonstress test interpretation also performed which reveals     reactive nonstress test.                                               Roylene Reason. Lisette Grinder, M.D.    DPC/MEDQ  D:  07/14/2002  T:  07/15/2002  Job:  [id number]

## 2015-07-07 ENCOUNTER — Encounter (HOSPITAL_COMMUNITY): Payer: Self-pay | Admitting: *Deleted

## 2015-07-07 ENCOUNTER — Emergency Department (HOSPITAL_COMMUNITY)
Admission: EM | Admit: 2015-07-07 | Discharge: 2015-07-07 | Disposition: A | Discharge status: Home / Self Care | Payer: Medicaid Other | Attending: Emergency Medicine | Admitting: Emergency Medicine

## 2015-07-07 DIAGNOSIS — I10 Essential (primary) hypertension: Secondary | ICD-10-CM | POA: Insufficient documentation

## 2015-07-07 DIAGNOSIS — E119 Type 2 diabetes mellitus without complications: Secondary | ICD-10-CM | POA: Diagnosis not present

## 2015-07-07 DIAGNOSIS — R300 Dysuria: Secondary | ICD-10-CM | POA: Diagnosis present

## 2015-07-07 DIAGNOSIS — Z880 Allergy status to penicillin: Secondary | ICD-10-CM | POA: Diagnosis not present

## 2015-07-07 DIAGNOSIS — Z720 Tobacco use: Secondary | ICD-10-CM | POA: Diagnosis not present

## 2015-07-07 DIAGNOSIS — N390 Urinary tract infection, site not specified: Secondary | ICD-10-CM | POA: Diagnosis not present

## 2015-07-07 DIAGNOSIS — Z3202 Encounter for pregnancy test, result negative: Secondary | ICD-10-CM | POA: Diagnosis not present

## 2015-07-07 HISTORY — DX: Type 2 diabetes mellitus without complications: E11.9

## 2015-07-07 HISTORY — DX: Essential (primary) hypertension: I10

## 2015-07-07 LAB — URINE MICROSCOPIC-ADD ON

## 2015-07-07 LAB — URINALYSIS, ROUTINE W REFLEX MICROSCOPIC
BILIRUBIN URINE: NEGATIVE
GLUCOSE, UA: 250 mg/dL — AB
KETONES UR: NEGATIVE mg/dL
NITRITE: POSITIVE — AB
Specific Gravity, Urine: 1.02 (ref 1.005–1.030)
Urobilinogen, UA: 0.2 mg/dL (ref 0.0–1.0)
pH: 7 (ref 5.0–8.0)

## 2015-07-07 LAB — PREGNANCY, URINE: PREG TEST UR: NEGATIVE

## 2015-07-07 MED ORDER — SULFAMETHOXAZOLE-TRIMETHOPRIM 800-160 MG PO TABS: 1.0000 | ORAL_TABLET | Freq: Two times a day (BID) | ORAL | Status: AC

## 2015-07-07 MED ORDER — PHENAZOPYRIDINE HCL 200 MG PO TABS: 200.0000 mg | ORAL_TABLET | Freq: Three times a day (TID) | ORAL | Status: DC

## 2015-07-07 MED ORDER — FLUCONAZOLE 150 MG PO TABS: 150.0000 mg | ORAL_TABLET | Freq: Every day | ORAL | Status: AC

## 2015-07-07 NOTE — ED Provider Notes (Signed)
CSN: [id number]     Arrival date & time 07/07/15  1123 History   First MD Initiated Contact with Patient 07/07/15 1140     Chief Complaint  Patient presents with  . Dysuria     (Consider location/radiation/quality/duration/timing/severity/associated sxs/prior Treatment) HPI Comments: Pt comes in with c/o painful urination times 1 week. Denies fever, n/v/d, vaginal discharge. She states that if feel like previous uti. Has a small amount of back pain.   The history is provided by the patient. No language interpreter was used.    Past Medical History  Diagnosis Date  . Diabetes mellitus without complication   . Hypertension    History reviewed. No pertinent past surgical history. History reviewed. No pertinent family history. History  Substance Use Topics  . Smoking status: Heavy Tobacco Smoker -- 0.50 packs/day  . Smokeless tobacco: Not on file  . Alcohol Use: No   OB History    No data available     Review of Systems  All other systems reviewed and are negative.     Allergies  Penicillins  Home Medications   Prior to Admission medications   Medication Sig Start Date End Date Taking? Authorizing Provider  GLIPIZIDE PO Take by mouth.   Yes Historical Provider, MD  ibuprofen (ADVIL,MOTRIN) 800 MG tablet Take 800 mg by mouth every 8 (eight) hours as needed.   Yes Historical Provider, MD  LISINOPRIL PO Take by mouth.   Yes Historical Provider, MD  ALPRAZolam (XANAX) 1 MG tablet TK 1 TABLET BY MOUTH TID PRN 06/04/15   Historical Provider, MD  amphetamine-dextroamphetamine (ADDERALL) 30 MG tablet TK 1 TABLET BY MOUTH BID 06/04/15   Historical Provider, MD  fluconazole (DIFLUCAN) 150 MG tablet Take 1 tablet (150 mg total) by mouth daily. 07/07/15 07/14/15  Teressa Lower, NP  phenazopyridine (PYRIDIUM) 200 MG tablet Take 1 tablet (200 mg total) by mouth 3 (three) times daily. 07/07/15   Teressa Lower, NP  sulfamethoxazole-trimethoprim (BACTRIM DS,SEPTRA DS) 800-160 MG per tablet  Take 1 tablet by mouth 2 (two) times daily. 07/07/15 07/14/15  Teressa Lower, NP   BP 137/80 mmHg  Pulse 82  Temp(Src) 98.7 F (37.1 C) (Oral)  Resp 16  Ht 5\' 2"  (1.575 m)  Wt 218 lb (98.884 kg)  BMI 39.86 kg/m2  SpO2 100% Physical Exam  Constitutional: She is oriented to person, place, and time. She appears well-developed and well-nourished.  Cardiovascular: Normal rate and regular rhythm.   Pulmonary/Chest: Effort normal and breath sounds normal.  Abdominal: Soft. Bowel sounds are normal. There is no tenderness. There is no CVA tenderness.  Musculoskeletal: Normal range of motion.  Neurological: She is alert and oriented to person, place, and time.  Skin: Skin is warm and dry.  Nursing note and vitals reviewed.   ED Course  Procedures (including critical care time) Labs Review Labs Reviewed  URINALYSIS, ROUTINE W REFLEX MICROSCOPIC (NOT AT [facility]) - Abnormal; Notable for the following:    APPearance CLOUDY (*)    Glucose, UA 250 (*)    Hgb urine dipstick LARGE (*)    Protein, ur TRACE (*)    Nitrite POSITIVE (*)    Leukocytes, UA LARGE (*)    All other components within normal limits  URINE MICROSCOPIC-ADD ON - Abnormal; Notable for the following:    Squamous Epithelial / LPF FEW (*)    Bacteria, UA MANY (*)    All other components within normal limits  URINE CULTURE  PREGNANCY, URINE    Imaging  Review No results found.   EKG Interpretation None      MDM   Final diagnoses:  UTI (lower urinary tract infection)    Will treat for simple uti with bactrim and pyridium. Pt requesting diflucan as gets yeast infection. Discussed return precautions    Teressa Lower, NP 07/07/15 1206  Donnetta Hutching, MD 07/09/15 1253

## 2015-07-07 NOTE — ED Notes (Signed)
Pt seen and eval by EDPa for initial assessment. 

## 2015-07-07 NOTE — Discharge Instructions (Signed)

## 2015-07-07 NOTE — ED Notes (Signed)
Patient reports painful urination x 1 week.

## 2015-07-09 LAB — URINE CULTURE: Culture: >=100000

## 2015-07-10 ENCOUNTER — Telehealth (HOSPITAL_COMMUNITY): Payer: Self-pay

## 2015-07-10 NOTE — Telephone Encounter (Signed)
Post ED Visit - Positive Culture Follow-up  Culture report reviewed by antimicrobial stewardship pharmacist:  Wes Dulaney, Pharm.D., BCPS  Celedonio Miyamoto, Pharm.D., BCPS  Georgina Pillion, Pharm.D., BCPS  [place], [address].D., BCPS, AAHIVP  Estella Husk, Pharm.D., BCPS, AAHIVP  Elder Cyphers, [address].D., BCPS X  Stewart,Cassie, Pharm. D.  Positive Urine culture, >/= 100,000 colonies E Coli Treated with Sulfa Trimeth, organism sensitive to the same and no further patient follow-up is required at this time.  Arvid Right 07/10/2015, 11:40 AM

## 2016-04-23 ENCOUNTER — Encounter (HOSPITAL_COMMUNITY): Payer: Self-pay

## 2016-04-23 ENCOUNTER — Emergency Department (HOSPITAL_COMMUNITY)
Admission: EM | Admit: 2016-04-23 | Discharge: 2016-04-23 | Disposition: A | Discharge status: Home / Self Care | Payer: Medicaid Other | Attending: Emergency Medicine | Admitting: Emergency Medicine

## 2016-04-23 DIAGNOSIS — Y929 Unspecified place or not applicable: Secondary | ICD-10-CM | POA: Insufficient documentation

## 2016-04-23 DIAGNOSIS — Y939 Activity, unspecified: Secondary | ICD-10-CM | POA: Insufficient documentation

## 2016-04-23 DIAGNOSIS — F17200 Nicotine dependence, unspecified, uncomplicated: Secondary | ICD-10-CM | POA: Diagnosis not present

## 2016-04-23 DIAGNOSIS — E119 Type 2 diabetes mellitus without complications: Secondary | ICD-10-CM | POA: Diagnosis not present

## 2016-04-23 DIAGNOSIS — R519 Headache, unspecified: Secondary | ICD-10-CM

## 2016-04-23 DIAGNOSIS — W57XXXA Bitten or stung by nonvenomous insect and other nonvenomous arthropods, initial encounter: Secondary | ICD-10-CM | POA: Diagnosis not present

## 2016-04-23 DIAGNOSIS — S30860A Insect bite (nonvenomous) of lower back and pelvis, initial encounter: Secondary | ICD-10-CM | POA: Insufficient documentation

## 2016-04-23 DIAGNOSIS — Z791 Long term (current) use of non-steroidal anti-inflammatories (NSAID): Secondary | ICD-10-CM | POA: Insufficient documentation

## 2016-04-23 DIAGNOSIS — E785 Hyperlipidemia, unspecified: Secondary | ICD-10-CM | POA: Insufficient documentation

## 2016-04-23 DIAGNOSIS — Y999 Unspecified external cause status: Secondary | ICD-10-CM | POA: Insufficient documentation

## 2016-04-23 DIAGNOSIS — Z79899 Other long term (current) drug therapy: Secondary | ICD-10-CM | POA: Insufficient documentation

## 2016-04-23 DIAGNOSIS — R51 Headache: Secondary | ICD-10-CM | POA: Diagnosis not present

## 2016-04-23 DIAGNOSIS — I10 Essential (primary) hypertension: Secondary | ICD-10-CM | POA: Diagnosis not present

## 2016-04-23 HISTORY — DX: Hyperlipidemia, unspecified: E78.5

## 2016-04-23 HISTORY — DX: Fibromyalgia: M79.7

## 2016-04-23 HISTORY — DX: Headache: R51

## 2016-04-23 HISTORY — DX: Headache, unspecified: R51.9

## 2016-04-23 LAB — BASIC METABOLIC PANEL
Anion gap: 7 (ref 5–15)
BUN: 13 mg/dL (ref 6–20)
CHLORIDE: 106 mmol/L (ref 101–111)
CO2: 26 mmol/L (ref 22–32)
Calcium: 8.6 mg/dL — ABNORMAL LOW (ref 8.9–10.3)
Creatinine, Ser: 0.75 mg/dL (ref 0.44–1.00)
GFR calc Af Amer: >60 mL/min (ref >60)
GFR calc non Af Amer: >60 mL/min (ref >60)
GLUCOSE: 125 mg/dL — AB (ref 65–99)
POTASSIUM: 3.2 mmol/L — AB (ref 3.5–5.1)
SODIUM: 139 mmol/L (ref 135–145)

## 2016-04-23 LAB — I-STAT BETA HCG BLOOD, ED (MC, WL, AP ONLY): I-stat hCG, quantitative: <5 m[IU]/mL (ref <5)

## 2016-04-23 MED ORDER — DIPHENHYDRAMINE HCL 50 MG/ML IJ SOLN
50.0000 mg | Freq: Once | INTRAMUSCULAR | Status: AC
  Administered 2016-04-23: 50 mg via INTRAVENOUS
  Filled 2016-04-23: qty 1

## 2016-04-23 MED ORDER — DOXYCYCLINE HYCLATE 100 MG PO CAPS: 100.0000 mg | ORAL_CAPSULE | Freq: Two times a day (BID) | ORAL | Status: DC

## 2016-04-23 MED ORDER — SODIUM CHLORIDE 0.9 % IV BOLUS (SEPSIS)
1000.0000 mL | Freq: Once | INTRAVENOUS | Status: AC
  Administered 2016-04-23: 1000 mL via INTRAVENOUS

## 2016-04-23 MED ORDER — KETOROLAC TROMETHAMINE 30 MG/ML IJ SOLN
30.0000 mg | Freq: Once | INTRAMUSCULAR | Status: AC
  Administered 2016-04-23: 30 mg via INTRAVENOUS
  Filled 2016-04-23: qty 1

## 2016-04-23 MED ORDER — ACETAMINOPHEN 500 MG PO TABS
1000.0000 mg | ORAL_TABLET | Freq: Once | ORAL | Status: AC
  Administered 2016-04-23: 1000 mg via ORAL
  Filled 2016-04-23: qty 2

## 2016-04-23 MED ORDER — METOCLOPRAMIDE HCL 5 MG/ML IJ SOLN
10.0000 mg | Freq: Once | INTRAMUSCULAR | Status: AC
  Administered 2016-04-23: 10 mg via INTRAVENOUS
  Filled 2016-04-23: qty 2

## 2016-04-23 NOTE — ED Notes (Signed)
Pt states understanding of care given and follow up instructions.  Ambulated out of ED.  Significant other in vehicle to transport pt home

## 2016-04-23 NOTE — Discharge Instructions (Signed)
Follow-up closely with your primary doctor. Return to the ER for new concerning rashes, neck stiffness, persistent fevers, neurologic symptoms.  If you were given medicines take as directed.  If you are on coumadin or contraceptives realize their levels and effectiveness is altered by many different medicines.  If you have any reaction (rash, tongues swelling, other) to the medicines stop taking and see a physician.    If your blood pressure was elevated in the ER make sure you follow up for management with a primary doctor or return for chest pain, shortness of breath or stroke symptoms.  Please follow up as directed and return to the ER or see a physician for new or worsening symptoms.  Thank you. Filed Vitals:   04/23/16 0048  BP: 110/72  Pulse: 66  Temp: 97.7 F (36.5 C)  TempSrc: Oral  Resp: 18  Height: 5\' 2"  (1.575 m)  Weight: 220 lb (99.791 kg)  SpO2: 100%

## 2016-04-23 NOTE — ED Notes (Signed)
Pt reports headache x 3 days, states this am pain has localized more to the left side of her temple and into the left side of her face.

## 2016-04-23 NOTE — ED Notes (Signed)
Pt sleeping, per Dr. Request, woke pt and provided with Diet Sprite.  Instructed pt to sip on drink as to not cause vomiting.

## 2016-04-23 NOTE — ED Provider Notes (Signed)
CSN: [id number]     Arrival date & time 04/23/16  0037 History   First MD Initiated Contact with Patient 04/23/16 [id number]     Chief Complaint  Patient presents with  . Headache     (Consider location/radiation/quality/duration/timing/severity/associated sxs/prior Treatment) HPI Comments: 35 year old female fibromyalgia, headache history, tobacco abuse, Bell's palsy presents with headache for 3 days. Initially mild similar previous and has gradually worsened and localized left side of the face. Overall similar previous. Patient said her glucose has been controlled recently. No fevers or chills. No neck stiffness. Patient did remove a tick from right back region 2 weeks prior. Unsure how long he was on there, no rash appreciated. No fevers.  Patient is a 35 y.o. female presenting with headaches. The history is provided by the patient.  Headache Associated symptoms: fatigue, nausea and photophobia   Associated symptoms: no abdominal pain, no back pain, no congestion, no fever, no neck pain, no neck stiffness and no vomiting     Past Medical History  Diagnosis Date  . Diabetes mellitus without complication (HCC)   . Hypertension   . Headache   . Fibromyalgia   . Hyperlipidemia    History reviewed. No pertinent past surgical history. No family history on file. Social History  Substance Use Topics  . Smoking status: Heavy Tobacco Smoker -- 0.50 packs/day  . Smokeless tobacco: None  . Alcohol Use: No   OB History    No data available     Review of Systems  Constitutional: Positive for appetite change and fatigue. Negative for fever and chills.  HENT: Negative for congestion.   Eyes: Positive for photophobia. Negative for visual disturbance.  Respiratory: Negative for shortness of breath.   Cardiovascular: Negative for chest pain.  Gastrointestinal: Positive for nausea. Negative for vomiting and abdominal pain.  Genitourinary: Negative for dysuria and flank pain.  Musculoskeletal:  Negative for back pain, neck pain and neck stiffness.  Skin: Negative for rash.  Neurological: Positive for light-headedness and headaches.      Allergies  Penicillins  Home Medications   Prior to Admission medications   Medication Sig Start Date End Date Taking? Authorizing Provider  ALPRAZolam Prudy Feeler(XANAX) 1 MG tablet TK 1 TABLET BY MOUTH TID PRN 06/04/15  Yes Historical Provider, MD  amLODipine (NORVASC) 10 MG tablet Take 10 mg by mouth daily.   Yes Historical Provider, MD  ibuprofen (ADVIL,MOTRIN) 800 MG tablet Take 800 mg by mouth every 8 (eight) hours as needed.   Yes Historical Provider, MD  lisinopril-hydrochlorothiazide (PRINZIDE,ZESTORETIC) 10-12.5 MG tablet Take 1 tablet by mouth daily.   Yes Historical Provider, MD  pravastatin (PRAVACHOL) 20 MG tablet Take 20 mg by mouth daily.   Yes Historical Provider, MD  amphetamine-dextroamphetamine (ADDERALL) 30 MG tablet TK 1 TABLET BY MOUTH BID 06/04/15   Historical Provider, MD  doxycycline (VIBRAMYCIN) 100 MG capsule Take 1 capsule (100 mg total) by mouth 2 (two) times daily. One po bid x 7 days 04/23/16   Blane OharaJoshua Analise Glotfelty, MD  GLIPIZIDE PO Take by mouth.    Historical Provider, MD  LISINOPRIL PO Take by mouth.    Historical Provider, MD  phenazopyridine (PYRIDIUM) 200 MG tablet Take 1 tablet (200 mg total) by mouth 3 (three) times daily. 07/07/15   Teressa LowerVrinda Pickering, NP   BP 110/72 mmHg  Pulse 66  Temp(Src) 97.7 F (36.5 C) (Oral)  Resp 18  Ht 5\' 2"  (1.575 m)  Wt 220 lb (99.791 kg)  BMI 40.23 kg/m2  SpO2 100%  LMP 04/11/2016 Physical Exam  Constitutional: She is oriented to person, place, and time. She appears well-developed and well-nourished.  HENT:  Head: Normocephalic and atraumatic.  Mild dry mucous membranes  Eyes: Conjunctivae are normal. Right eye exhibits no discharge. Left eye exhibits no discharge.  Neck: Normal range of motion. Neck supple. No tracheal deviation present.  Cardiovascular: Normal rate and regular rhythm.    Pulmonary/Chest: Effort normal and breath sounds normal.  Abdominal: Soft. She exhibits no distension. There is no tenderness. There is no guarding.  Musculoskeletal: She exhibits no edema.  Neurological: She is alert and oriented to person, place, and time. No cranial nerve deficit.  General fatigue appearance. 5+ strength upper lower extremity is bilateral sensation intact to major nerves. Finger-nose intact.  Skin: Skin is warm. No rash noted.  Psychiatric: She has a normal mood and affect.  Nursing note and vitals reviewed.   ED Course  Procedures (including critical care time) Labs Review Labs Reviewed  BASIC METABOLIC PANEL  I-STAT BETA HCG BLOOD, ED ([facility], [facility], [facility] ONLY)    Imaging Review No results found. I have personally reviewed and evaluated these images and lab results as part of my medical decision-making.   EKG Interpretation None      MDM   Final diagnoses:  Headache, unspecified headache type  Tick bite   Patient presents with headache, body aches with recent tick bite 2 weeks ago. Patient well-appearing, headache improved in ER. Oral fluids given. Screening blood work with diabetes history unremarkable. Patient stable for close outpatient follow-up. Plan for doxycycline for 10 days reasons return given.  Results and differential diagnosis were discussed with the patient/parent/guardian. Xrays were independently reviewed by myself.  Close follow up outpatient was discussed, comfortable with the plan.   Medications  sodium chloride 0.9 % bolus 1,000 mL (0 mLs Intravenous Stopped 04/23/16 0217)  metoCLOPramide (REGLAN) injection 10 mg (10 mg Intravenous Given 04/23/16 0143)  diphenhydrAMINE (BENADRYL) injection 50 mg (50 mg Intravenous Given 04/23/16 0143)  acetaminophen (TYLENOL) tablet 1,000 mg (1,000 mg Oral Given 04/23/16 0147)  ketorolac (TORADOL) 30 MG/ML injection 30 mg (30 mg Intravenous Given 04/23/16 0227)    Filed Vitals:   04/23/16 0048  BP: 110/72   Pulse: 66  Temp: 97.7 F (36.5 C)  TempSrc: Oral  Resp: 18  Height:  (1.575 m)  Weight: 220 lb (99.791 kg)  SpO2: 100%    Final diagnoses:  Headache, unspecified headache type  Tick bite        Blane Ohara, MD 04/23/16 [phone]

## 2016-07-15 ENCOUNTER — Encounter (HOSPITAL_COMMUNITY): Payer: Self-pay | Admitting: Emergency Medicine

## 2016-07-15 ENCOUNTER — Emergency Department (HOSPITAL_COMMUNITY)
Admission: EM | Admit: 2016-07-15 | Discharge: 2016-07-15 | Disposition: A | Discharge status: Home / Self Care | Payer: Medicaid Other | Attending: Emergency Medicine | Admitting: Emergency Medicine

## 2016-07-15 DIAGNOSIS — Z794 Long term (current) use of insulin: Secondary | ICD-10-CM | POA: Insufficient documentation

## 2016-07-15 DIAGNOSIS — Z79899 Other long term (current) drug therapy: Secondary | ICD-10-CM | POA: Insufficient documentation

## 2016-07-15 DIAGNOSIS — R109 Unspecified abdominal pain: Secondary | ICD-10-CM | POA: Insufficient documentation

## 2016-07-15 DIAGNOSIS — E119 Type 2 diabetes mellitus without complications: Secondary | ICD-10-CM | POA: Insufficient documentation

## 2016-07-15 DIAGNOSIS — R110 Nausea: Secondary | ICD-10-CM | POA: Diagnosis not present

## 2016-07-15 DIAGNOSIS — Z791 Long term (current) use of non-steroidal anti-inflammatories (NSAID): Secondary | ICD-10-CM | POA: Insufficient documentation

## 2016-07-15 DIAGNOSIS — I10 Essential (primary) hypertension: Secondary | ICD-10-CM | POA: Insufficient documentation

## 2016-07-15 DIAGNOSIS — N939 Abnormal uterine and vaginal bleeding, unspecified: Secondary | ICD-10-CM | POA: Diagnosis not present

## 2016-07-15 LAB — CBC WITH DIFFERENTIAL/PLATELET
BASOS ABS: 0 10*3/uL (ref 0.0–0.1)
BASOS PCT: 0 %
EOS PCT: 2 %
Eosinophils Absolute: 0.1 10*3/uL (ref 0.0–0.7)
HCT: 41.3 % (ref 36.0–46.0)
HEMOGLOBIN: 14.2 g/dL (ref 12.0–15.0)
LYMPHS ABS: 2.5 10*3/uL (ref 0.7–4.0)
Lymphocytes Relative: 36 %
MCH: 32.3 pg (ref 26.0–34.0)
MCHC: 34.4 g/dL (ref 30.0–36.0)
MCV: 94.1 fL (ref 78.0–100.0)
Monocytes Absolute: 0.4 10*3/uL (ref 0.1–1.0)
Monocytes Relative: 6 %
NEUTROS PCT: 56 %
Neutro Abs: 4 10*3/uL (ref 1.7–7.7)
PLATELETS: 172 10*3/uL (ref 150–400)
RBC: 4.39 MIL/uL (ref 3.87–5.11)
RDW: 12.3 % (ref 11.5–15.5)
WBC: 7 10*3/uL (ref 4.0–10.5)

## 2016-07-15 LAB — WET PREP, GENITAL
Clue Cells Wet Prep HPF POC: NONE SEEN
SPERM: NONE SEEN
TRICH WET PREP: NONE SEEN
Yeast Wet Prep HPF POC: NONE SEEN

## 2016-07-15 LAB — I-STAT BETA HCG BLOOD, ED (MC, WL, AP ONLY)

## 2016-07-15 LAB — ABO/RH: ABO/RH(D): A POS

## 2016-07-15 NOTE — ED Notes (Signed)
Beta Stat <0.5. H. Neese Verified.

## 2016-07-15 NOTE — ED Triage Notes (Signed)
Pt states she found out August 3rd she was pregnant on bloodwork from Dr's office.  Pt states she began having cramping and bleeding today with clots.

## 2016-07-15 NOTE — ED Provider Notes (Signed)
[facility]-EMERGENCY DEPT Provider Note   CSN: [id number] Arrival date & time: 07/15/16  [id number]     History   Chief Complaint Chief Complaint  Patient presents with  . Vaginal Bleeding    HPI Caitlin Mcguire is a 35 y.o. female [id number], LMP 06/10/16 and had a positive pregnancy test at her PCP office 06/30/16. She reports that she had low back pain two days ago and then started spotting last night. Today the pain increased and she began bleeding heavier than a period with clots.   The history is provided by the patient. No language interpreter was used.  Vaginal Bleeding  Primary symptoms include vaginal bleeding. There has been no fever. This is a new problem. The current episode started yesterday. The problem has been gradually worsening. The symptoms occur spontaneously. She has missed her period. The patient's menstrual history has been regular. Associated symptoms include abdominal pain and nausea. She has tried nothing for the symptoms. Sexual activity: sexually active. She uses nothing for contraception. Associated medical issues include ovarian cysts and miscarriage. Associated medical issues do not include STD, PID, herpes simplex, endometriosis, gynecological surgery, ectopic pregnancy, Cesarean section or terminated pregnancy.    Past Medical History:  Diagnosis Date  . Diabetes mellitus without complication (HCC)   . Fibromyalgia   . Headache   . Hyperlipidemia   . Hypertension     Patient Active Problem List   Diagnosis Date Noted  . BACK PAIN 01/17/2008    History reviewed. No pertinent surgical history.  OB History    Gravida Para Term Preterm AB Living   6       3 2    SAB TAB Ectopic Multiple Live Births   3               Home Medications    Prior to Admission medications   Medication Sig Start Date End Date Taking? Authorizing Provider  ALPRAZolam (XANAX) 1 MG tablet TK 1 TABLET BY MOUTH THREE TIMES DAILY 06/04/15  Yes Historical Provider, MD  amLODipine  (NORVASC) 10 MG tablet Take 10 mg by mouth daily as needed (FOR HIGH BLOOD PRESSURE LEVELS).    Yes Historical Provider, MD  amphetamine-dextroamphetamine (ADDERALL) 30 MG tablet TK 1 TABLET BY MOUTH THREE TIMES DAILY 06/04/15  Yes Historical Provider, MD  butalbital-acetaminophen-caffeine (FIORICET, ESGIC) 50-325-40 MG tablet Take 1 tablet by mouth 2 (two) times daily as needed for headache.   Yes Historical Provider, MD  gabapentin (NEURONTIN) 300 MG capsule Take 300 mg by mouth 3 (three) times daily.   Yes Historical Provider, MD  glipiZIDE (GLUCOTROL) 10 MG tablet Take 10 mg by mouth daily before breakfast.   Yes Historical Provider, MD  ibuprofen (ADVIL,MOTRIN) 800 MG tablet Take 800 mg by mouth every 8 (eight) hours as needed.   Yes Historical Provider, MD  lisinopril-hydrochlorothiazide (PRINZIDE,ZESTORETIC) 10-12.5 MG tablet Take 1 tablet by mouth daily.   Yes Historical Provider, MD  oxyCODONE (ROXICODONE) 15 MG immediate release tablet Take 15 mg by mouth daily.   Yes Historical Provider, MD  oxyCODONE-acetaminophen (PERCOCET) 10-325 MG tablet Take 1 tablet by mouth 4 (four) times daily.   Yes Historical Provider, MD  pravastatin (PRAVACHOL) 20 MG tablet Take 60 mg by mouth at bedtime.    Yes Historical Provider, MD  tiZANidine (ZANAFLEX) 4 MG tablet Take 4 mg by mouth every 6 (six) hours as needed for muscle spasms.   Yes Historical Provider, MD  doxycycline (VIBRAMYCIN) 100 MG capsule Take 1 capsule (  100 mg total) by mouth 2 (two) times daily. One po bid x 7 days Patient not taking: Reported on 07/15/2016 04/23/16   Blane OharaJoshua Zavitz, MD    Family History History reviewed. No pertinent family history.  Social History Social History  Substance Use Topics  . Smoking status: Heavy Tobacco Smoker    Packs/day: 0.50  . Smokeless tobacco: Not on file  . Alcohol use No     Allergies   Penicillins   Review of Systems Review of Systems  Gastrointestinal: Positive for abdominal pain and  nausea.  Genitourinary: Positive for vaginal bleeding.  all other systems negative   Physical Exam Updated Vital Signs BP 112/64 (BP Location: Right Arm)   Pulse 82   Temp 97.8 F (36.6 C) (Oral)   Resp 17   Ht 5\' 2"  (1.575 m)   Wt 101.6 kg   LMP  (LMP Unknown)   SpO2 99%   BMI 40.97 kg/m   Physical Exam  Constitutional: She is oriented to person, place, and time. She appears well-developed and well-nourished. No distress.  HENT:  Head: Normocephalic and atraumatic.  Eyes: EOM are normal.  Neck: Neck supple.  Cardiovascular: Normal rate.   Pulmonary/Chest: Effort normal.  Abdominal: Soft.  Mildly tender lower abdomen with deep palpation, no guarding or rebound.   Genitourinary:  Genitourinary Comments: External genitalia without lesions, moderate blood vaginal vault, cervix closed, no CMT, no adnexal tenderness, uterus without palpable enlargement.   Musculoskeletal: Normal range of motion.  Neurological: She is alert and oriented to person, place, and time. No cranial nerve deficit.  Skin: Skin is warm and dry.  Psychiatric: She has a normal mood and affect. Her behavior is normal.  Nursing note and vitals reviewed.    ED Treatments / Results  Labs (all labs ordered are listed, but only abnormal results are displayed) Labs Reviewed  WET PREP, GENITAL - Abnormal; Notable for the following:       Result Value   WBC, Wet Prep HPF POC FEW (*)    All other components within normal limits  CBC WITH DIFFERENTIAL/PLATELET  I-STAT BETA HCG BLOOD, ED ([facility], [facility], [facility] ONLY)  ABO/RH  GC/CHLAMYDIA PROBE AMP ([facility]) NOT AT [facility]    Radiology No results found.  Procedures Procedures (including critical care time)  Medications Ordered in ED Medications - No data to display   Initial Impression / Assessment and Plan / ED Course  I have reviewed the triage vital signs and the nursing notes.  Pertinent lab results that were available during my care of the patient were  reviewed by me and considered in my medical decision making (see chart for details).  Clinical Course  Bhcg is <5. Patient's LMP was 06/10/16. This is most likely dysmenorrhea.   Final Clinical Impressions(s) / ED Diagnoses  35 y.o. female with vaginal bleeding stable for d/c without hemorrhage, normal CBC and negative pregnancy.  She will f/u with OB/GYN.   Final diagnoses:  Vaginal bleeding    New Prescriptions Discharge Medication List as of 07/15/2016 10:18 PM       Janne NapoleonHope M Brandonn Capelli, NP 07/16/16 0110    Caitlin Grizzleanielle Ray, MD 07/21/16 1241

## 2016-07-15 NOTE — Discharge Instructions (Signed)
Your blood pregnancy test here tonight is negative. [facility] if [facility].

## 2016-07-18 LAB — GC/CHLAMYDIA PROBE AMP (~~LOC~~) NOT AT ARMC
CHLAMYDIA, DNA PROBE: NEGATIVE
NEISSERIA GONORRHEA: NEGATIVE

## 2016-09-03 ENCOUNTER — Emergency Department (HOSPITAL_COMMUNITY): Payer: Medicaid Other

## 2016-09-03 ENCOUNTER — Encounter (HOSPITAL_COMMUNITY): Payer: Self-pay | Admitting: Emergency Medicine

## 2016-09-03 ENCOUNTER — Emergency Department (HOSPITAL_COMMUNITY)
Admission: EM | Admit: 2016-09-03 | Discharge: 2016-09-03 | Disposition: A | Discharge status: Home / Self Care | Payer: Medicaid Other | Attending: Emergency Medicine | Admitting: Emergency Medicine

## 2016-09-03 DIAGNOSIS — Z7984 Long term (current) use of oral hypoglycemic drugs: Secondary | ICD-10-CM | POA: Diagnosis not present

## 2016-09-03 DIAGNOSIS — Z79899 Other long term (current) drug therapy: Secondary | ICD-10-CM | POA: Diagnosis not present

## 2016-09-03 DIAGNOSIS — R1011 Right upper quadrant pain: Secondary | ICD-10-CM | POA: Diagnosis present

## 2016-09-03 DIAGNOSIS — R197 Diarrhea, unspecified: Secondary | ICD-10-CM | POA: Diagnosis not present

## 2016-09-03 DIAGNOSIS — I10 Essential (primary) hypertension: Secondary | ICD-10-CM | POA: Insufficient documentation

## 2016-09-03 DIAGNOSIS — R1084 Generalized abdominal pain: Secondary | ICD-10-CM | POA: Diagnosis not present

## 2016-09-03 DIAGNOSIS — E119 Type 2 diabetes mellitus without complications: Secondary | ICD-10-CM | POA: Insufficient documentation

## 2016-09-03 DIAGNOSIS — F17200 Nicotine dependence, unspecified, uncomplicated: Secondary | ICD-10-CM | POA: Insufficient documentation

## 2016-09-03 DIAGNOSIS — R112 Nausea with vomiting, unspecified: Secondary | ICD-10-CM | POA: Insufficient documentation

## 2016-09-03 LAB — COMPREHENSIVE METABOLIC PANEL
ALBUMIN: 4 g/dL (ref 3.5–5.0)
ALT: 31 U/L (ref 14–54)
AST: 21 U/L (ref 15–41)
Alkaline Phosphatase: 55 U/L (ref 38–126)
Anion gap: 6 (ref 5–15)
BUN: 13 mg/dL (ref 6–20)
CHLORIDE: 109 mmol/L (ref 101–111)
CO2: 25 mmol/L (ref 22–32)
Calcium: 8.9 mg/dL (ref 8.9–10.3)
Creatinine, Ser: 0.81 mg/dL (ref 0.44–1.00)
GFR calc Af Amer: >60 mL/min (ref >60)
GFR calc non Af Amer: >60 mL/min (ref >60)
Glucose, Bld: 107 mg/dL — ABNORMAL HIGH (ref 65–99)
POTASSIUM: 4.1 mmol/L (ref 3.5–5.1)
Sodium: 140 mmol/L (ref 135–145)
Total Bilirubin: 0.3 mg/dL (ref 0.3–1.2)
Total Protein: 7.3 g/dL (ref 6.5–8.1)

## 2016-09-03 LAB — CBC WITH DIFFERENTIAL/PLATELET
BASOS ABS: 0 10*3/uL (ref 0.0–0.1)
Basophils Relative: 0 %
EOS ABS: 0.2 10*3/uL (ref 0.0–0.7)
EOS PCT: 2 %
HCT: 42.6 % (ref 36.0–46.0)
Hemoglobin: 14.9 g/dL (ref 12.0–15.0)
Lymphocytes Relative: 32 %
Lymphs Abs: 3.4 10*3/uL (ref 0.7–4.0)
MCH: 33.4 pg (ref 26.0–34.0)
MCHC: 35 g/dL (ref 30.0–36.0)
MCV: 95.5 fL (ref 78.0–100.0)
MONO ABS: 0.9 10*3/uL (ref 0.1–1.0)
Monocytes Relative: 9 %
Neutro Abs: 6.1 10*3/uL (ref 1.7–7.7)
Neutrophils Relative %: 57 %
PLATELETS: 178 10*3/uL (ref 150–400)
RBC: 4.46 MIL/uL (ref 3.87–5.11)
RDW: 12.4 % (ref 11.5–15.5)
WBC: 10.5 10*3/uL (ref 4.0–10.5)

## 2016-09-03 LAB — LIPASE, BLOOD: Lipase: 24 U/L (ref 11–51)

## 2016-09-03 LAB — ETHANOL: Alcohol, Ethyl (B): <5 mg/dL (ref <5)

## 2016-09-03 LAB — CBG MONITORING, ED: Glucose-Capillary: 114 mg/dL — ABNORMAL HIGH (ref 65–99)

## 2016-09-03 MED ORDER — ONDANSETRON HCL 4 MG/2ML IJ SOLN
4.0000 mg | Freq: Once | INTRAMUSCULAR | Status: AC
  Administered 2016-09-03: 4 mg via INTRAVENOUS
  Filled 2016-09-03: qty 2

## 2016-09-03 MED ORDER — DIPHENOXYLATE-ATROPINE 2.5-0.025 MG PO TABS: 1.0000 | ORAL_TABLET | Freq: Four times a day (QID) | ORAL | 0 refills | Status: DC | PRN

## 2016-09-03 MED ORDER — DICYCLOMINE HCL 10 MG/ML IM SOLN
20.0000 mg | Freq: Once | INTRAMUSCULAR | Status: AC
  Administered 2016-09-03: 20 mg via INTRAMUSCULAR
  Filled 2016-09-03: qty 2

## 2016-09-03 MED ORDER — MORPHINE SULFATE (PF) 2 MG/ML IV SOLN
2.0000 mg | INTRAVENOUS | Status: DC | PRN
  Administered 2016-09-03: 2 mg via INTRAVENOUS
  Filled 2016-09-03: qty 1

## 2016-09-03 MED ORDER — DICYCLOMINE HCL 20 MG PO TABS: 20.0000 mg | ORAL_TABLET | Freq: Two times a day (BID) | ORAL | 0 refills | Status: DC

## 2016-09-03 MED ORDER — MORPHINE SULFATE (PF) 4 MG/ML IV SOLN: 4.0000 mg | INTRAVENOUS | Status: DC | PRN

## 2016-09-03 MED ORDER — ONDANSETRON 4 MG PO TBDP: 4.0000 mg | ORAL_TABLET | Freq: Three times a day (TID) | ORAL | 0 refills | Status: DC | PRN

## 2016-09-03 MED ORDER — MORPHINE SULFATE (PF) 2 MG/ML IV SOLN
2.0000 mg | INTRAVENOUS | Status: DC | PRN
  Administered 2016-09-03: 2 mg via INTRAMUSCULAR
  Filled 2016-09-03: qty 1

## 2016-09-03 NOTE — Discharge Instructions (Signed)
Clear liquids tonight and tomorrow.  Zofran for nausea. Bentyl for pain or cramps. Lomotil for diarrhea.

## 2016-09-03 NOTE — ED Triage Notes (Signed)
Pt c/o upper abd pain. EDP at bedside.

## 2016-09-03 NOTE — ED Provider Notes (Signed)
[facility]Y DEPT Provider Note   CSN: [id number] Arrival date & time: 09/03/16  1752     History   Chief Complaint Chief Complaint  Patient presents with  . Abdominal Pain    HPI Caitlin Mcguire is a 35 y.o. female. She presents with a complaint of abdominal pain. On presentation she is speaking minimally holding her abdomen and moaning. Ultimately she is able to slow down her breathing and become more cooperative and able to converse. She states she's had abdominal cramping with vomiting and diarrhea today and her pain seems localized to her right upper quadrant a few hours ago. Patient cleaned a history of fibromyalgia. States she takes Percocet for this. States is prescribed 3 times per day. She is rather evasive but states she does not take it most days. She states is been 3 days since she has had. States she is not out but has simply not needed it.  Medications also include Fioricet, Adderall, and Xanax for migraine headaches, ADHD, and anxiety. History of non-insulin diabetes. Hypertension.  No recent fevers or chills. States she had some shakes earlier in the day today. She "might have had food poisoning". No headache. No difficulty breathing or chest pain. No blood pus or mucus in her stools. Heme-negative nonbilious emesis. No dysuria frequency or hematuria.   HPI  Past Medical History:  Diagnosis Date  . Diabetes mellitus without complication (HCC)   . Fibromyalgia   . Headache   . Hyperlipidemia   . Hypertension     Patient Active Problem List   Diagnosis Date Noted  . BACK PAIN 01/17/2008    History reviewed. No pertinent surgical history.  OB History    Gravida Para Term Preterm AB Living   6       3 2    SAB TAB Ectopic Multiple Live Births   3               Home Medications    Prior to Admission medications   Medication Sig Start Date End Date Taking? Authorizing Provider  ALPRAZolam (XANAX) 1 MG tablet TK 1 TABLET BY MOUTH THREE TIMES DAILY  06/04/15   Historical Provider, MD  amLODipine (NORVASC) 10 MG tablet Take 10 mg by mouth daily as needed (FOR HIGH BLOOD PRESSURE LEVELS).     Historical Provider, MD  amphetamine-dextroamphetamine (ADDERALL) 30 MG tablet TK 1 TABLET BY MOUTH THREE TIMES DAILY 06/04/15   Historical Provider, MD  butalbital-acetaminophen-caffeine (FIORICET, ESGIC) 50-325-40 MG tablet Take 1 tablet by mouth 2 (two) times daily as needed for headache.    Historical Provider, MD  dicyclomine (BENTYL) 20 MG tablet Take 1 tablet (20 mg total) by mouth 2 (two) times daily. 09/03/16   Rolland Porter, MD  diphenoxylate-atropine (LOMOTIL) 2.5-0.025 MG tablet Take 1 tablet by mouth 4 (four) times daily as needed for diarrhea or loose stools. 09/03/16   Rolland Porter, MD  doxycycline (VIBRAMYCIN) 100 MG capsule Take 1 capsule (100 mg total) by mouth 2 (two) times daily. One po bid x 7 days Patient not taking: Reported on 07/15/2016 04/23/16   Blane Ohara, MD  gabapentin (NEURONTIN) 300 MG capsule Take 300 mg by mouth 3 (three) times daily.    Historical Provider, MD  glipiZIDE (GLUCOTROL) 10 MG tablet Take 10 mg by mouth daily before breakfast.    Historical Provider, MD  ibuprofen (ADVIL,MOTRIN) 800 MG tablet Take 800 mg by mouth every 8 (eight) hours as needed.    Historical Provider, MD  lisinopril-hydrochlorothiazide (  PRINZIDE,ZESTORETIC) 10-12.5 MG tablet Take 1 tablet by mouth daily.    Historical Provider, MD  ondansetron (ZOFRAN ODT) 4 MG disintegrating tablet Take 1 tablet (4 mg total) by mouth every 8 (eight) hours as needed for nausea. 09/03/16   Rolland PorterMark Reverie Vaquera, MD  oxyCODONE (ROXICODONE) 15 MG immediate release tablet Take 15 mg by mouth daily.    Historical Provider, MD  oxyCODONE-acetaminophen (PERCOCET) 10-325 MG tablet Take 1 tablet by mouth 4 (four) times daily.    Historical Provider, MD  pravastatin (PRAVACHOL) 20 MG tablet Take 60 mg by mouth at bedtime.     Historical Provider, MD  tiZANidine (ZANAFLEX) 4 MG tablet Take 4 mg  by mouth every 6 (six) hours as needed for muscle spasms.    Historical Provider, MD    Family History No family history on file.  Social History Social History  Substance Use Topics  . Smoking status: Heavy Tobacco Smoker    Packs/day: 0.50  . Smokeless tobacco: Never Used  . Alcohol use No     Allergies   Penicillins   Review of Systems Review of Systems  Constitutional: Negative for appetite change, chills, diaphoresis, fatigue and fever.  HENT: Negative for mouth sores, sore throat and trouble swallowing.   Eyes: Negative for visual disturbance.  Respiratory: Negative for cough, chest tightness, shortness of breath and wheezing.   Cardiovascular: Negative for chest pain.  Gastrointestinal: Positive for abdominal pain, diarrhea, nausea and vomiting. Negative for abdominal distention.  Endocrine: Negative for polydipsia, polyphagia and polyuria.  Genitourinary: Negative for dysuria, frequency and hematuria.  Musculoskeletal: Negative for gait problem.  Skin: Negative for color change, pallor and rash.  Neurological: Negative for dizziness, syncope, light-headedness and headaches.  Hematological: Does not bruise/bleed easily.  Psychiatric/Behavioral: Negative for behavioral problems and confusion.     Physical Exam Updated Vital Signs BP 124/87   Pulse 101   Temp 99.2 F (37.3 C)   Resp 20   Ht 5\' 2"  (1.575 m)   Wt 230 lb (104.3 kg)   LMP 08/11/2016   SpO2 97%   BMI 42.07 kg/m   Physical Exam  Constitutional: No distress.  Obese. Moaning. Eyes closed. Hyperventilating.  HENT:  Head: Normocephalic.  Eyes: Conjunctivae are normal. Pupils are equal, round, and reactive to light. No scleral icterus.  Neck: Normal range of motion. Neck supple. No thyromegaly present.  Cardiovascular: Normal rate and regular rhythm.  Exam reveals no gallop and no friction rub.   No murmur heard. Pulmonary/Chest: Effort normal and breath sounds normal. No respiratory distress.  She has no wheezes. She has no rales.  Abdominal: Soft. Bowel sounds are normal. She exhibits no distension. There is no tenderness. There is no rebound.  Musculoskeletal: Normal range of motion.  Neurological: She is alert.  Skin: Skin is warm and dry. No rash noted.  Psychiatric: She has a normal mood and affect. Her behavior is normal.     ED Treatments / Results  Labs (all labs ordered are listed, but only abnormal results are displayed) Labs Reviewed  COMPREHENSIVE METABOLIC PANEL - Abnormal; Notable for the following:       Result Value   Glucose, Bld 107 (*)    All other components within normal limits  CBG MONITORING, ED - Abnormal; Notable for the following:    Glucose-Capillary 114 (*)    All other components within normal limits  CBC WITH DIFFERENTIAL/PLATELET  LIPASE, BLOOD  ETHANOL  URINALYSIS, ROUTINE W REFLEX MICROSCOPIC (NOT AT [facility])  URINE RAPID DRUG SCREEN, HOSP PERFORMED    EKG  EKG Interpretation None       Radiology Ct Renal Stone Study  Result Date: 09/03/2016 CLINICAL DATA:  No acute abnormalities within the abdomen or pelvis on this nonenhanced CT EXAM: CT ABDOMEN AND PELVIS WITHOUT CONTRAST TECHNIQUE: Multidetector CT imaging of the abdomen and pelvis was performed following the standard protocol without IV contrast. COMPARISON:  None. FINDINGS: Lower chest: No acute abnormality. Hepatobiliary: No focal liver abnormality is seen. No gallstones, gallbladder wall thickening, or biliary dilatation. Pancreas: Unremarkable. No pancreatic ductal dilatation or surrounding inflammatory changes. Spleen: Normal in size without focal abnormality. Adrenals/Urinary Tract: Adrenal glands are unremarkable. Kidneys are normal, without renal calculi, focal lesion, or hydronephrosis. Bladder is unremarkable. Stomach/Bowel: Stomach is within normal limits. Appendix appears normal. No evidence of bowel wall thickening, distention, or inflammatory changes. Vascular/Lymphatic:  No significant vascular findings are present. No enlarged abdominal or pelvic lymph nodes. Reproductive: Uterus and bilateral adnexa are unremarkable. Other: No abdominal wall hernia or abnormality. No abdominopelvic ascites. Musculoskeletal: No acute or significant osseous findings. Posterior disc protrusion is seen at L4-L5 and L5-S1. IMPRESSION: No acute abnormalities within the abdomen or pelvis on this nonenhanced CT. Posterior disc protrusion at L4-L5 and L5-S1. Electronically Signed   By: Ted Mcalpine M.D.   On: 09/03/2016 20:19    Procedures Procedures (including critical care time)  Medications Ordered in ED Medications  morphine 2 MG/ML injection 2 mg (2 mg Intravenous Given 09/03/16 1813)  morphine 2 MG/ML injection 2 mg (2 mg Intramuscular Given 09/03/16 2129)  ondansetron (ZOFRAN) injection 4 mg (4 mg Intravenous Given 09/03/16 1813)  dicyclomine (BENTYL) injection 20 mg (20 mg Intramuscular Given 09/03/16 2130)  ondansetron (ZOFRAN) injection 4 mg (4 mg Intravenous Given 09/03/16 2128)     Initial Impression / Assessment and Plan / ED Course  I have reviewed the triage vital signs and the nursing notes.  Pertinent labs & imaging results that were available during my care of the patient were reviewed by me and considered in my medical decision making (see chart for details).  Clinical Course    Reassuring studies. I think her symptoms are all consistent with gastroenteritis and hyperventilation. The rest or discuss her multiple controlled substance medication prescriptions with her primary care physician. However for tonight I think she can simply be treated with clear liquids, Zofran, Bentyl, Lomotil for her resumed viral gastroenteritis.  Final Clinical Impressions(s) / ED Diagnoses   Final diagnoses:  Generalized abdominal pain    New Prescriptions New Prescriptions   DICYCLOMINE (BENTYL) 20 MG TABLET    Take 1 tablet (20 mg total) by mouth 2 (two) times daily.    DIPHENOXYLATE-ATROPINE (LOMOTIL) 2.5-0.025 MG TABLET    Take 1 tablet by mouth 4 (four) times daily as needed for diarrhea or loose stools.   ONDANSETRON (ZOFRAN ODT) 4 MG DISINTEGRATING TABLET    Take 1 tablet (4 mg total) by mouth every 8 (eight) hours as needed for nausea.     Rolland Porter, MD 09/03/16 2135

## 2016-09-03 NOTE — ED Notes (Addendum)
2 oval light blue pills, scored, imprint [id number]- found in plastic bag under left breast.   Identified as Alprazolam 1 mg via pill identification web site.

## 2017-01-23 ENCOUNTER — Encounter (HOSPITAL_COMMUNITY): Payer: Self-pay | Admitting: Emergency Medicine

## 2017-01-23 ENCOUNTER — Emergency Department (HOSPITAL_COMMUNITY)
Admission: EM | Admit: 2017-01-23 | Discharge: 2017-01-23 | Disposition: A | Discharge status: Home / Self Care | Payer: Medicaid Other | Attending: Emergency Medicine | Admitting: Emergency Medicine

## 2017-01-23 ENCOUNTER — Emergency Department (HOSPITAL_COMMUNITY): Payer: Medicaid Other

## 2017-01-23 DIAGNOSIS — E119 Type 2 diabetes mellitus without complications: Secondary | ICD-10-CM | POA: Insufficient documentation

## 2017-01-23 DIAGNOSIS — F17200 Nicotine dependence, unspecified, uncomplicated: Secondary | ICD-10-CM | POA: Insufficient documentation

## 2017-01-23 DIAGNOSIS — Z7984 Long term (current) use of oral hypoglycemic drugs: Secondary | ICD-10-CM | POA: Insufficient documentation

## 2017-01-23 DIAGNOSIS — R05 Cough: Secondary | ICD-10-CM | POA: Diagnosis present

## 2017-01-23 DIAGNOSIS — I10 Essential (primary) hypertension: Secondary | ICD-10-CM | POA: Insufficient documentation

## 2017-01-23 DIAGNOSIS — J111 Influenza due to unidentified influenza virus with other respiratory manifestations: Secondary | ICD-10-CM

## 2017-01-23 DIAGNOSIS — R69 Illness, unspecified: Secondary | ICD-10-CM

## 2017-01-23 DIAGNOSIS — J9801 Acute bronchospasm: Secondary | ICD-10-CM | POA: Insufficient documentation

## 2017-01-23 DIAGNOSIS — J069 Acute upper respiratory infection, unspecified: Secondary | ICD-10-CM | POA: Insufficient documentation

## 2017-01-23 DIAGNOSIS — Z79899 Other long term (current) drug therapy: Secondary | ICD-10-CM | POA: Diagnosis not present

## 2017-01-23 LAB — COMPREHENSIVE METABOLIC PANEL
ALBUMIN: 3.3 g/dL — AB (ref 3.5–5.0)
ALK PHOS: 53 U/L (ref 38–126)
ALT: 20 U/L (ref 14–54)
AST: 18 U/L (ref 15–41)
Anion gap: 5 (ref 5–15)
BUN: 10 mg/dL (ref 6–20)
CO2: 26 mmol/L (ref 22–32)
CREATININE: 0.75 mg/dL (ref 0.44–1.00)
Calcium: 8.3 mg/dL — ABNORMAL LOW (ref 8.9–10.3)
Chloride: 109 mmol/L (ref 101–111)
GFR calc Af Amer: >60 mL/min (ref >60)
GFR calc non Af Amer: >60 mL/min (ref >60)
GLUCOSE: 211 mg/dL — AB (ref 65–99)
Potassium: 3.7 mmol/L (ref 3.5–5.1)
SODIUM: 140 mmol/L (ref 135–145)
Total Bilirubin: 0.3 mg/dL (ref 0.3–1.2)
Total Protein: 6 g/dL — ABNORMAL LOW (ref 6.5–8.1)

## 2017-01-23 LAB — CBC WITH DIFFERENTIAL/PLATELET
BASOS PCT: 0 %
Basophils Absolute: 0 10*3/uL (ref 0.0–0.1)
Eosinophils Absolute: 0.2 10*3/uL (ref 0.0–0.7)
Eosinophils Relative: 3 %
HCT: 37.6 % (ref 36.0–46.0)
HEMOGLOBIN: 13.3 g/dL (ref 12.0–15.0)
LYMPHS ABS: 2 10*3/uL (ref 0.7–4.0)
Lymphocytes Relative: 28 %
MCH: 33.4 pg (ref 26.0–34.0)
MCHC: 35.4 g/dL (ref 30.0–36.0)
MCV: 94.5 fL (ref 78.0–100.0)
Monocytes Absolute: 0.6 10*3/uL (ref 0.1–1.0)
Monocytes Relative: 8 %
NEUTROS PCT: 61 %
Neutro Abs: 4.3 10*3/uL (ref 1.7–7.7)
Platelets: 120 10*3/uL — ABNORMAL LOW (ref 150–400)
RBC: 3.98 MIL/uL (ref 3.87–5.11)
RDW: 12.6 % (ref 11.5–15.5)
WBC: 7 10*3/uL (ref 4.0–10.5)

## 2017-01-23 LAB — CBG MONITORING, ED: Glucose-Capillary: 223 mg/dL — ABNORMAL HIGH (ref 65–99)

## 2017-01-23 MED ORDER — OSELTAMIVIR PHOSPHATE 75 MG PO CAPS: 75.0000 mg | ORAL_CAPSULE | Freq: Two times a day (BID) | ORAL | 0 refills | Status: DC

## 2017-01-23 MED ORDER — OSELTAMIVIR PHOSPHATE 75 MG PO CAPS
75.0000 mg | ORAL_CAPSULE | Freq: Once | ORAL | Status: AC
  Administered 2017-01-23: 75 mg via ORAL
  Filled 2017-01-23: qty 1

## 2017-01-23 MED ORDER — ALBUTEROL SULFATE (2.5 MG/3ML) 0.083% IN NEBU
2.5000 mg | INHALATION_SOLUTION | Freq: Once | RESPIRATORY_TRACT | Status: AC
  Administered 2017-01-23: 2.5 mg via RESPIRATORY_TRACT
  Filled 2017-01-23: qty 3

## 2017-01-23 MED ORDER — TRAMADOL HCL 50 MG PO TABS: 50.0000 mg | ORAL_TABLET | Freq: Four times a day (QID) | ORAL | 0 refills | Status: DC | PRN

## 2017-01-23 MED ORDER — IPRATROPIUM-ALBUTEROL 0.5-2.5 (3) MG/3ML IN SOLN
3.0000 mL | Freq: Once | RESPIRATORY_TRACT | Status: AC
  Administered 2017-01-23: 3 mL via RESPIRATORY_TRACT
  Filled 2017-01-23: qty 3

## 2017-01-23 MED ORDER — ONDANSETRON HCL 4 MG/2ML IJ SOLN
4.0000 mg | Freq: Once | INTRAMUSCULAR | Status: AC
  Administered 2017-01-23: 4 mg via INTRAVENOUS
  Filled 2017-01-23: qty 2

## 2017-01-23 MED ORDER — KETOROLAC TROMETHAMINE 30 MG/ML IJ SOLN
30.0000 mg | Freq: Once | INTRAMUSCULAR | Status: AC
  Administered 2017-01-23: 30 mg via INTRAVENOUS
  Filled 2017-01-23: qty 1

## 2017-01-23 MED ORDER — ALBUTEROL SULFATE HFA 108 (90 BASE) MCG/ACT IN AERS
2.0000 | INHALATION_SPRAY | RESPIRATORY_TRACT | Status: DC | PRN
  Administered 2017-01-23: 2 via RESPIRATORY_TRACT

## 2017-01-23 MED ORDER — SODIUM CHLORIDE 0.9 % IV BOLUS (SEPSIS)
1000.0000 mL | Freq: Once | INTRAVENOUS | Status: AC
  Administered 2017-01-23: 1000 mL via INTRAVENOUS

## 2017-01-23 NOTE — Discharge Instructions (Signed)
Drink plenty of fluids. Take Tylenol for fevers. Follow-up with your doctor if not improving

## 2017-01-23 NOTE — ED Provider Notes (Signed)
[facility] Provider Note   CSN: [id number] Arrival date & time: 01/23/17  1753     History   Chief Complaint Chief Complaint  Patient presents with  . Generalized Body Aches  . Cough    HPI Caitlin Mcguire is a 36 y.o. female.  Patient complains of cough and congestion aches for 2 days.   The history is provided by the patient. No language interpreter was used.  Cough  This is a new problem. The current episode started 2 days ago. The problem occurs constantly. The problem has not changed since onset.The cough is non-productive. The maximum temperature recorded prior to her arrival was 100 to 100.9 F. Pertinent negatives include no chest pain and no headaches.    Past Medical History:  Diagnosis Date  . Diabetes mellitus without complication (HCC)   . Fibromyalgia   . Headache   . Hyperlipidemia   . Hypertension     Patient Active Problem List   Diagnosis Date Noted  . BACK PAIN 01/17/2008    History reviewed. No pertinent surgical history.  OB History    Gravida Para Term Preterm AB Living   6       3 2    SAB TAB Ectopic Multiple Live Births   3               Home Medications    Prior to Admission medications   Medication Sig Start Date End Date Taking? Authorizing Provider  ALPRAZolam (XANAX) 1 MG tablet TAKE 1 TABLET BY MOUTH THREE TIMES DAILY 06/04/15  Yes Historical Provider, MD  amphetamine-dextroamphetamine (ADDERALL) 30 MG tablet TAKE 1 TABLET BY MOUTH THREE TIMES DAILY 06/04/15  Yes Historical Provider, MD  butalbital-acetaminophen-caffeine (FIORICET, ESGIC) 50-325-40 MG tablet Take 1 tablet by mouth 2 (two) times daily as needed for headache.   Yes Historical Provider, MD  gabapentin (NEURONTIN) 300 MG capsule Take 300 mg by mouth 3 (three) times daily.   Yes Historical Provider, MD  glipiZIDE (GLUCOTROL) 10 MG tablet Take 10 mg by mouth daily before breakfast.   Yes Historical Provider, MD  ibuprofen (ADVIL,MOTRIN) 800 MG tablet Take 800 mg  by mouth every 8 (eight) hours as needed for mild pain or moderate pain.    Yes Historical Provider, MD  lisinopril-hydrochlorothiazide (PRINZIDE,ZESTORETIC) 10-12.5 MG tablet Take 1 tablet by mouth daily.   Yes Historical Provider, MD  oxyCODONE (ROXICODONE) 15 MG immediate release tablet Take 15 mg by mouth daily.   Yes Historical Provider, MD  oxyCODONE-acetaminophen (PERCOCET) 10-325 MG tablet Take 1 tablet by mouth 4 (four) times daily.   Yes Historical Provider, MD  pravastatin (PRAVACHOL) 20 MG tablet Take 60 mg by mouth at bedtime.    Yes Historical Provider, MD  Pseudoeph-Doxylamine-DM-APAP (DAYQUIL/NYQUIL COLD/FLU RELIEF PO) Take 2 capsules by mouth 2 (two) times daily as needed (FOR COLD AND FLU).   Yes Historical Provider, MD  tiZANidine (ZANAFLEX) 4 MG tablet Take 4 mg by mouth every 6 (six) hours as needed for muscle spasms.   Yes Historical Provider, MD  doxycycline (VIBRAMYCIN) 100 MG capsule Take 1 capsule (100 mg total) by mouth 2 (two) times daily. One po bid x 7 days Patient not taking: Reported on 07/15/2016 04/23/16   Blane Ohara, MD  oseltamivir (TAMIFLU) 75 MG capsule Take 1 capsule (75 mg total) by mouth every 12 (twelve) hours. 01/23/17   Bethann Berkshire, MD  traMADol (ULTRAM) 50 MG tablet Take 1 tablet (50 mg total) by mouth every 6 (six)  hours as needed. 01/23/17   Bethann Berkshire, MD    Family History History reviewed. No pertinent family history.  Social History Social History  Substance Use Topics  . Smoking status: Heavy Tobacco Smoker    Packs/day: 0.50  . Smokeless tobacco: Never Used  . Alcohol use No     Allergies   Penicillins   Review of Systems Review of Systems  Constitutional: Negative for appetite change and fatigue.  HENT: Negative for congestion, ear discharge and sinus pressure.   Eyes: Negative for discharge.  Respiratory: Positive for cough.   Cardiovascular: Negative for chest pain.  Gastrointestinal: Negative for abdominal pain and  diarrhea.  Genitourinary: Negative for frequency and hematuria.  Musculoskeletal: Negative for back pain.  Skin: Negative for rash.  Neurological: Negative for seizures and headaches.  Psychiatric/Behavioral: Negative for hallucinations.     Physical Exam Updated Vital Signs BP 148/77 (BP Location: Right Arm)   Pulse 95   Temp 98.8 F (37.1 C) (Oral)   Resp 24   Ht 5\' 3"  (1.6 m)   Wt 220 lb (99.8 kg)   LMP 12/23/2016   SpO2 99%   BMI 38.97 kg/m   Physical Exam  Constitutional: She is oriented to person, place, and time. She appears well-developed.  HENT:  Head: Normocephalic.  Eyes: Conjunctivae and EOM are normal. No scleral icterus.  Neck: Neck supple. No thyromegaly present.  Cardiovascular: Normal rate and regular rhythm.  Exam reveals no gallop and no friction rub.   No murmur heard. Pulmonary/Chest: No stridor. She has wheezes. She has no rales. She exhibits no tenderness.  Abdominal: She exhibits no distension. There is no tenderness. There is no rebound.  Musculoskeletal: Normal range of motion. She exhibits no edema.  Lymphadenopathy:    She has no cervical adenopathy.  Neurological: She is oriented to person, place, and time. She exhibits normal muscle tone. Coordination normal.  Skin: No rash noted. No erythema.  Psychiatric: She has a normal mood and affect. Her behavior is normal.     ED Treatments / Results  Labs (all labs ordered are listed, but only abnormal results are displayed) Labs Reviewed  CBC WITH DIFFERENTIAL/PLATELET - Abnormal; Notable for the following:       Result Value   Platelets 120 (*)    All other components within normal limits  COMPREHENSIVE METABOLIC PANEL - Abnormal; Notable for the following:    Glucose, Bld 211 (*)    Calcium 8.3 (*)    Total Protein 6.0 (*)    Albumin 3.3 (*)    All other components within normal limits  CBG MONITORING, ED - Abnormal; Notable for the following:    Glucose-Capillary 223 (*)    All other  components within normal limits    EKG  EKG Interpretation None       Radiology Dg Chest 2 View  Result Date: 01/23/2017 CLINICAL DATA:  Mid chest pain, shortness of breath and coughing since Saturday. EXAM: CHEST  2 VIEW COMPARISON:  Chest x-ray dated 01/27/2009. FINDINGS: Study is hypoinspiratory with crowding of the perihilar and bibasilar bronchovascular markings. Given the low lung volumes, lungs are clear. No evidence of pneumonia. No pleural effusion or pneumothorax seen. Heart size and mediastinal contours are within normal limits. Osseous structures about the chest are unremarkable. IMPRESSION: Low lung volumes. No active cardiopulmonary disease. No evidence of pneumonia or pulmonary edema. Electronically Signed   By: Bary Richard M.D.   On: 01/23/2017 19:35    Procedures Procedures (  including critical care time)  Medications Ordered in ED Medications  oseltamivir (TAMIFLU) capsule 75 mg (not administered)  albuterol (PROVENTIL HFA;VENTOLIN HFA) 108 (90 Base) MCG/ACT inhaler 2 puff (not administered)  ipratropium-albuterol (DUONEB) 0.5-2.5 (3) MG/3ML nebulizer solution 3 mL (3 mLs Nebulization Given 01/23/17 1842)  albuterol (PROVENTIL) (2.5 MG/3ML) 0.083% nebulizer solution 2.5 mg (2.5 mg Nebulization Given 01/23/17 1842)  sodium chloride 0.9 % bolus 1,000 mL (1,000 mLs Intravenous New Bag/Given 01/23/17 1918)  ketorolac (TORADOL) 30 MG/ML injection 30 mg (30 mg Intravenous Given 01/23/17 1918)  ondansetron (ZOFRAN) injection 4 mg (4 mg Intravenous Given 01/23/17 1918)     Initial Impression / Assessment and Plan / ED Course  I have reviewed the triage vital signs and the nursing notes.  Pertinent labs & imaging results that were available during my care of the patient were reviewed by me and considered in my medical decision making (see chart for details).     Patient with upper respiratory infection and bronchospasm. Patient will be treated with Tamiflu for  influenza  Final Clinical Impressions(s) / ED Diagnoses   Final diagnoses:  Influenza-like illness    New Prescriptions New Prescriptions   OSELTAMIVIR (TAMIFLU) 75 MG CAPSULE    Take 1 capsule (75 mg total) by mouth every 12 (twelve) hours.   TRAMADOL (ULTRAM) 50 MG TABLET    Take 1 tablet (50 mg total) by mouth every 6 (six) hours as needed.     Bethann BerkshireJoseph Maki Hege, MD 01/23/17 2109

## 2017-01-23 NOTE — ED Notes (Signed)
Per respiratory therapist, patient needs Duoneb and albuterol treatment. Patient has expiratory wheezing noted.

## 2017-01-23 NOTE — ED Triage Notes (Signed)
Pt c/o fever, cough and body aches. nad

## 2017-05-21 ENCOUNTER — Encounter (HOSPITAL_COMMUNITY): Payer: Self-pay | Admitting: Emergency Medicine

## 2017-05-21 ENCOUNTER — Emergency Department (HOSPITAL_COMMUNITY)
Admission: EM | Admit: 2017-05-21 | Discharge: 2017-05-21 | Disposition: A | Discharge status: Home / Self Care | Payer: Medicaid Other | Attending: Emergency Medicine | Admitting: Emergency Medicine

## 2017-05-21 DIAGNOSIS — R112 Nausea with vomiting, unspecified: Secondary | ICD-10-CM | POA: Diagnosis not present

## 2017-05-21 DIAGNOSIS — E119 Type 2 diabetes mellitus without complications: Secondary | ICD-10-CM | POA: Diagnosis not present

## 2017-05-21 DIAGNOSIS — R1084 Generalized abdominal pain: Secondary | ICD-10-CM | POA: Insufficient documentation

## 2017-05-21 DIAGNOSIS — I10 Essential (primary) hypertension: Secondary | ICD-10-CM | POA: Insufficient documentation

## 2017-05-21 DIAGNOSIS — R197 Diarrhea, unspecified: Secondary | ICD-10-CM | POA: Diagnosis present

## 2017-05-21 DIAGNOSIS — F17210 Nicotine dependence, cigarettes, uncomplicated: Secondary | ICD-10-CM | POA: Diagnosis not present

## 2017-05-21 LAB — COMPREHENSIVE METABOLIC PANEL
ALT: 29 U/L (ref 14–54)
AST: 23 U/L (ref 15–41)
Albumin: 3.9 g/dL (ref 3.5–5.0)
Alkaline Phosphatase: 58 U/L (ref 38–126)
Anion gap: 9 (ref 5–15)
BUN: 11 mg/dL (ref 6–20)
CHLORIDE: 104 mmol/L (ref 101–111)
CO2: 27 mmol/L (ref 22–32)
CREATININE: 0.71 mg/dL (ref 0.44–1.00)
Calcium: 9 mg/dL (ref 8.9–10.3)
GFR calc Af Amer: >60 mL/min (ref >60)
GLUCOSE: 114 mg/dL — AB (ref 65–99)
POTASSIUM: 4.2 mmol/L (ref 3.5–5.1)
SODIUM: 140 mmol/L (ref 135–145)
Total Bilirubin: 0.5 mg/dL (ref 0.3–1.2)
Total Protein: 7.2 g/dL (ref 6.5–8.1)

## 2017-05-21 LAB — URINALYSIS, ROUTINE W REFLEX MICROSCOPIC
Bilirubin Urine: NEGATIVE
GLUCOSE, UA: NEGATIVE mg/dL
Hgb urine dipstick: NEGATIVE
KETONES UR: 5 mg/dL — AB
Leukocytes, UA: NEGATIVE
Nitrite: NEGATIVE
PH: 5 (ref 5.0–8.0)
PROTEIN: NEGATIVE mg/dL
Specific Gravity, Urine: 1.023 (ref 1.005–1.030)

## 2017-05-21 LAB — CBC
HEMATOCRIT: 42.1 % (ref 36.0–46.0)
Hemoglobin: 14.7 g/dL (ref 12.0–15.0)
MCH: 32.2 pg (ref 26.0–34.0)
MCHC: 34.9 g/dL (ref 30.0–36.0)
MCV: 92.1 fL (ref 78.0–100.0)
PLATELETS: 150 10*3/uL (ref 150–400)
RBC: 4.57 MIL/uL (ref 3.87–5.11)
RDW: 12.4 % (ref 11.5–15.5)
WBC: 10 10*3/uL (ref 4.0–10.5)

## 2017-05-21 LAB — DIFFERENTIAL
BASOS ABS: 0 10*3/uL (ref 0.0–0.1)
BASOS PCT: 0 %
EOS ABS: 0 10*3/uL (ref 0.0–0.7)
Eosinophils Relative: 0 %
Lymphocytes Relative: 7 %
Lymphs Abs: 0.7 10*3/uL (ref 0.7–4.0)
MONOS PCT: 4 %
Monocytes Absolute: 0.4 10*3/uL (ref 0.1–1.0)
Neutro Abs: 8.9 10*3/uL — ABNORMAL HIGH (ref 1.7–7.7)
Neutrophils Relative %: 88 %

## 2017-05-21 LAB — LIPASE, BLOOD: LIPASE: 17 U/L (ref 11–51)

## 2017-05-21 LAB — HCG, QUANTITATIVE, PREGNANCY: hCG, Beta Chain, Quant, S: 1 m[IU]/mL (ref <5)

## 2017-05-21 MED ORDER — ONDANSETRON HCL 4 MG PO TABS: 4.0000 mg | ORAL_TABLET | Freq: Three times a day (TID) | ORAL | 0 refills | Status: DC | PRN

## 2017-05-21 MED ORDER — SODIUM CHLORIDE 0.9 % IV BOLUS (SEPSIS)
1000.0000 mL | Freq: Once | INTRAVENOUS | Status: AC
  Administered 2017-05-21: 1000 mL via INTRAVENOUS

## 2017-05-21 MED ORDER — FAMOTIDINE IN NACL 20-0.9 MG/50ML-% IV SOLN
20.0000 mg | Freq: Once | INTRAVENOUS | Status: AC
  Administered 2017-05-21: 20 mg via INTRAVENOUS
  Filled 2017-05-21: qty 50

## 2017-05-21 MED ORDER — METOCLOPRAMIDE HCL 5 MG/ML IJ SOLN
10.0000 mg | Freq: Once | INTRAMUSCULAR | Status: AC
  Administered 2017-05-21: 10 mg via INTRAVENOUS
  Filled 2017-05-21: qty 2

## 2017-05-21 MED ORDER — ACETAMINOPHEN 325 MG PO TABS
650.0000 mg | ORAL_TABLET | Freq: Once | ORAL | Status: AC
  Administered 2017-05-21: 650 mg via ORAL
  Filled 2017-05-21: qty 2

## 2017-05-21 NOTE — ED Notes (Signed)
Diet sprite provided  Pt has had no V/D since arrival

## 2017-05-21 NOTE — ED Provider Notes (Signed)
[facility] Provider Note   CSN: [id number] Arrival date & time: 05/21/17  1348     History   Chief Complaint Chief Complaint  Patient presents with  . Abdominal Pain    HPI Caitlin Mcguire is a 36 y.o. female.  HPI  Pt was seen at 1720.  Per pt, c/o gradual onset and persistence of constant generalized abd "pain" since yesterday after "eating food from a convenience store."  Has been associated with multiple intermittent episodes of N/V/D.  Describes the abd pain as "cramping."  Denies fevers, no back pain, no rash, no CP/SOB, no black or blood in stools or emesis, no dysuria/hematuria, no vaginal bleeding/discharge, no pelvic pain. Pt states she was told by the [facility] she "is about [redacted] weeks pregnant."  States Hx G7P2, LMP 03/14/17.     Past Medical History:  Diagnosis Date  . Diabetes mellitus without complication (HCC)   . Fibromyalgia   . Headache   . Hyperlipidemia   . Hypertension     Patient Active Problem List   Diagnosis Date Noted  . BACK PAIN 01/17/2008    History reviewed. No pertinent surgical history.  OB History    Gravida Para Term Preterm AB Living   7       3 2    SAB TAB Ectopic Multiple Live Births   3               Home Medications    Prior to Admission medications   Medication Sig Start Date End Date Taking? Authorizing Provider  ALPRAZolam (XANAX) 1 MG tablet TAKE 1 TABLET BY MOUTH THREE TIMES DAILY 06/04/15   [provider]  amphetamine-dextroamphetamine (ADDERALL) 30 MG tablet TAKE 1 TABLET BY MOUTH THREE TIMES DAILY 06/04/15   [provider]  butalbital-acetaminophen-caffeine (FIORICET, ESGIC) 50-325-40 MG tablet Take 1 tablet by mouth 2 (two) times daily as needed for headache.    [provider]  doxycycline (VIBRAMYCIN) 100 MG capsule Take 1 capsule (100 mg total) by mouth 2 (two) times daily. One po bid x 7 days Patient not taking: Reported on 07/15/2016 04/23/16   Blane Ohara, MD    gabapentin (NEURONTIN) 300 MG capsule Take 300 mg by mouth 3 (three) times daily.    [provider]  glipiZIDE (GLUCOTROL) 10 MG tablet Take 10 mg by mouth daily before breakfast.    [provider]  ibuprofen (ADVIL,MOTRIN) 800 MG tablet Take 800 mg by mouth every 8 (eight) hours as needed for mild pain or moderate pain.     [provider]  lisinopril-hydrochlorothiazide (PRINZIDE,ZESTORETIC) 10-12.5 MG tablet Take 1 tablet by mouth daily.    [provider]  oseltamivir (TAMIFLU) 75 MG capsule Take 1 capsule (75 mg total) by mouth every 12 (twelve) hours. 01/23/17   Bethann Berkshire, MD  oxyCODONE (ROXICODONE) 15 MG immediate release tablet Take 15 mg by mouth daily.    [provider]  oxyCODONE-acetaminophen (PERCOCET) 10-325 MG tablet Take 1 tablet by mouth 4 (four) times daily.    [provider]  pravastatin (PRAVACHOL) 20 MG tablet Take 60 mg by mouth at bedtime.     [provider]  Pseudoeph-Doxylamine-DM-APAP (DAYQUIL/NYQUIL COLD/FLU RELIEF PO) Take 2 capsules by mouth 2 (two) times daily as needed (FOR COLD AND FLU).    [provider]  tiZANidine (ZANAFLEX) 4 MG tablet Take 4 mg by mouth every 6 (six) hours as needed for muscle spasms.    [provider]  traMADol (ULTRAM) 50 MG tablet Take 1 tablet (50 mg total) by mouth every 6 (six) hours as needed. 01/23/17   Bethann Berkshire, MD    Family History No family history on file.  Social History Social History  Substance Use Topics  . Smoking status: Heavy Tobacco Smoker    Packs/day: 0.50    Types: Cigarettes  . Smokeless tobacco: Never Used  . Alcohol use No     Allergies   Penicillins   Review of Systems Review of Systems ROS: Statement: All systems negative except as marked or noted in the HPI; Constitutional: Negative for fever and chills. ; ; Eyes: Negative for eye pain, redness and discharge. ; ; ENMT: Negative for ear pain, hoarseness,  nasal congestion, sinus pressure and sore throat. ; ; Cardiovascular: Negative for chest pain, palpitations, diaphoresis, dyspnea and peripheral edema. ; ; Respiratory: Negative for cough, wheezing and stridor. ; ; Gastrointestinal: +N/V/D, abd pain. Negative for blood in stool, hematemesis, jaundice and rectal bleeding. . ; ; Genitourinary: Negative for dysuria, flank pain and hematuria. ; ; GYN:  No pelvic pain, no vaginal bleeding, no vaginal discharge, no vulvar pain. ;; Musculoskeletal: Negative for back pain and neck pain. Negative for swelling and trauma.; ; Skin: Negative for pruritus, rash, abrasions, blisters, bruising and skin lesion.; ; Neuro: Negative for headache, lightheadedness and neck stiffness. Negative for weakness, altered level of consciousness, altered mental status, extremity weakness, paresthesias, involuntary movement, seizure and syncope.       Physical Exam Updated Vital Signs BP (!) 143/98 (BP Location: Right Wrist)   Pulse (!) 116   Temp 99.5 F (37.5 C) (Oral)   Resp 19   Ht 5\' 3"  (1.6 m)   Wt 108 kg (238 lb)   LMP 03/14/2017   SpO2 97%   BMI 42.16 kg/m    Patient Vitals for the past 24 hrs:  BP Temp Temp src Pulse Resp SpO2 Height Weight  05/21/17 2015 - - - 93 - 94 % - -  05/21/17 2000 (!) 140/95 - - 98 - 94 % - -  05/21/17 1945 - - - (!) 101 - 95 % - -  05/21/17 1930 (!) 136/92 - - (!) 101 - 96 % - -  05/21/17 1900 (!) 134/92 - - (!) 109 - 92 % - -  05/21/17 1845 - - - (!) 106 - 92 % - -  05/21/17 1830 (!) 120/99 - - (!) 103 - 95 % - -  05/21/17 1824 (!) 143/97 - - (!) 107 18 94 % - -  05/21/17 1419 - - - - - - 5\' 3"  (1.6 m) 108 kg (238 lb)  05/21/17 1418 (!) 143/98 99.5 F (37.5 C) Oral (!) 116 19 97 % - -     Physical Exam 1725: Physical examination:  Nursing notes reviewed; Vital signs and O2 SAT reviewed;  Constitutional: Well developed, Well nourished, Well hydrated, In no acute distress; Head:  Normocephalic, atraumatic; Eyes: EOMI, PERRL,  No scleral icterus; ENMT: Mouth and pharynx normal, Mucous membranes moist; Neck: Supple, Full range of motion, No lymphadenopathy; Cardiovascular: Regular rate and rhythm, No gallop; Respiratory: Breath sounds clear & equal bilaterally, No wheezes.  Speaking full sentences with ease, Normal respiratory effort/excursion; Chest: Nontender, Movement normal; Abdomen: Soft, Nontender, Nondistended, Normal bowel sounds; Genitourinary: No CVA tenderness; Extremities: Pulses normal, No tenderness, No edema, No calf edema or asymmetry.; Neuro: AA&Ox3, Major CN grossly intact.  Speech clear. No gross focal motor or sensory deficits  in extremities.; Skin: Color normal, Warm, Dry.   ED Treatments / Results  Labs (all labs ordered are listed, but only abnormal results are displayed)   EKG  EKG Interpretation None       Radiology   Procedures Procedures (including critical care time)  Medications Ordered in ED Medications  sodium chloride 0.9 % bolus 1,000 mL (not administered)  metoCLOPramide (REGLAN) injection 10 mg (not administered)  famotidine (PEPCID) IVPB 20 mg premix (not administered)  acetaminophen (TYLENOL) tablet 650 mg (not administered)     Initial Impression / Assessment and Plan / ED Course  I have reviewed the triage vital signs and the nursing notes.  Pertinent labs & imaging results that were available during my care of the patient were reviewed by me and considered in my medical decision making (see chart for details).  MDM Reviewed: previous chart, nursing note and vitals Reviewed previous: labs Interpretation: labs   Results for orders placed or performed during the hospital encounter of 05/21/17  Lipase, blood  Result Value Ref Range   Lipase 17 11 - 51 U/L  Comprehensive metabolic panel  Result Value Ref Range   Sodium 140 135 - 145 mmol/L   Potassium 4.2 3.5 - 5.1 mmol/L   Chloride 104 101 - 111 mmol/L   CO2 27 22 - 32 mmol/L   Glucose, Bld 114 (H) 65 -  99 mg/dL   BUN 11 6 - 20 mg/dL   Creatinine, Ser 1.61 0.44 - 1.00 mg/dL   Calcium 9.0 8.9 - 09.6 mg/dL   Total Protein 7.2 6.5 - 8.1 g/dL   Albumin 3.9 3.5 - 5.0 g/dL   AST 23 15 - 41 U/L   ALT 29 14 - 54 U/L   Alkaline Phosphatase 58 38 - 126 U/L   Total Bilirubin 0.5 0.3 - 1.2 mg/dL   GFR calc non Af Amer >60 >60 mL/min   GFR calc Af Amer >60 >60 mL/min   Anion gap 9 5 - 15  CBC  Result Value Ref Range   WBC 10.0 4.0 - 10.5 K/uL   RBC 4.57 3.87 - 5.11 MIL/uL   Hemoglobin 14.7 12.0 - 15.0 g/dL   HCT 04.5 40.9 - 81.1 %   MCV 92.1 78.0 - 100.0 fL   MCH 32.2 26.0 - 34.0 pg   MCHC 34.9 30.0 - 36.0 g/dL   RDW 91.4 78.2 - 95.6 %   Platelets 150 150 - 400 K/uL  Urinalysis, Routine w reflex microscopic  Result Value Ref Range   Color, Urine YELLOW YELLOW   APPearance HAZY (A) CLEAR   Specific Gravity, Urine 1.023 1.005 - 1.030   pH 5.0 5.0 - 8.0   Glucose, UA NEGATIVE NEGATIVE mg/dL   Hgb urine dipstick NEGATIVE NEGATIVE   Bilirubin Urine NEGATIVE NEGATIVE   Ketones, ur 5 (A) NEGATIVE mg/dL   Protein, ur NEGATIVE NEGATIVE mg/dL   Nitrite NEGATIVE NEGATIVE   Leukocytes, UA NEGATIVE NEGATIVE  hCG, quantitative, pregnancy  Result Value Ref Range   hCG, Beta Chain, Quant, S 1 <5 mIU/mL  Differential  Result Value Ref Range   Neutrophils Relative % 88 %   Neutro Abs 8.9 (H) 1.7 - 7.7 K/uL   Lymphocytes Relative 7 %   Lymphs Abs 0.7 0.7 - 4.0 K/uL   Monocytes Relative 4 %   Monocytes Absolute 0.4 0.1 - 1.0 K/uL   Eosinophils Relative 0 %   Eosinophils Absolute 0.0 0.0 - 0.7 K/uL   Basophils Relative 0 %  Basophils Absolute 0.0 0.0 - 0.1 K/uL    2020:  Pt is not pregnant.  Pt has tol PO well while in the ED without N/V.  No stooling while in the ED.  Abd remains benign, VS improved after IVF. Feels better and wants to go home now. Tx symptomatically at this time. Dx and testing d/w pt.  Questions answered.  Verb understanding, agreeable to d/c home with outpt  f/u.     Final Clinical Impressions(s) / ED Diagnoses   Final diagnoses:  None    New Prescriptions New Prescriptions   No medications on file     Samuel JesterMcManus, Kathyleen Radice, DO 05/24/17 1602

## 2017-05-21 NOTE — Discharge Instructions (Signed)
Take the prescription as directed.  Increase your fluid intake (ie:  Gatoraide) for the next few days.  Eat a bland diet and advance to your regular diet slowly as you can tolerate it.   Avoid full strength juices, as well as milk and milk products until your diarrhea has resolved.   Call your regular medical doctor topmorrow to schedule a follow up appointment this week.  Return to the Emergency Department immediately if not improving (or even worsening) despite taking the medicines as prescribed, any black or bloody stool or vomit, if you develop a fever over "101," or for any other concerns.

## 2017-05-21 NOTE — ED Triage Notes (Addendum)
Patient c/o generalized abd pain. Patient states that after she ate yesterday she started have abd pain. Patient reports nausea, vomiting, diarrhea, body aches, chills and headache started this morning. Patient denies any urinary symptoms. Patient reports being [redacted] weeks pregnant. Per reports 4th pregnancy-in which she had a miscarriage of twins in [id number]. Denies any vaginal bleeding.

## 2017-05-23 LAB — URINE CULTURE

## 2017-07-22 ENCOUNTER — Emergency Department (HOSPITAL_COMMUNITY)
Admission: EM | Admit: 2017-07-22 | Discharge: 2017-07-22 | Disposition: A | Discharge status: Home / Self Care | Payer: Medicaid Other | Attending: Emergency Medicine | Admitting: Emergency Medicine

## 2017-07-22 ENCOUNTER — Encounter (HOSPITAL_COMMUNITY): Payer: Self-pay | Admitting: Emergency Medicine

## 2017-07-22 ENCOUNTER — Emergency Department (HOSPITAL_COMMUNITY): Payer: Medicaid Other

## 2017-07-22 DIAGNOSIS — Z79899 Other long term (current) drug therapy: Secondary | ICD-10-CM | POA: Insufficient documentation

## 2017-07-22 DIAGNOSIS — M5442 Lumbago with sciatica, left side: Secondary | ICD-10-CM | POA: Insufficient documentation

## 2017-07-22 DIAGNOSIS — E119 Type 2 diabetes mellitus without complications: Secondary | ICD-10-CM | POA: Insufficient documentation

## 2017-07-22 DIAGNOSIS — Z7984 Long term (current) use of oral hypoglycemic drugs: Secondary | ICD-10-CM | POA: Insufficient documentation

## 2017-07-22 DIAGNOSIS — F17210 Nicotine dependence, cigarettes, uncomplicated: Secondary | ICD-10-CM | POA: Diagnosis not present

## 2017-07-22 DIAGNOSIS — M25552 Pain in left hip: Secondary | ICD-10-CM | POA: Insufficient documentation

## 2017-07-22 DIAGNOSIS — I10 Essential (primary) hypertension: Secondary | ICD-10-CM | POA: Diagnosis not present

## 2017-07-22 LAB — POC URINE PREG, ED: Preg Test, Ur: NEGATIVE

## 2017-07-22 MED ORDER — KETOROLAC TROMETHAMINE 60 MG/2ML IM SOLN
60.0000 mg | Freq: Once | INTRAMUSCULAR | Status: AC
  Administered 2017-07-22: 60 mg via INTRAMUSCULAR
  Filled 2017-07-22: qty 2

## 2017-07-22 MED ORDER — HYDROCODONE-ACETAMINOPHEN 5-325 MG PO TABS
1.0000 | ORAL_TABLET | Freq: Once | ORAL | Status: DC
  Filled 2017-07-22: qty 1

## 2017-07-22 MED ORDER — PREDNISONE 10 MG PO TABS: ORAL_TABLET | ORAL | 0 refills | Status: DC

## 2017-07-22 NOTE — ED Notes (Signed)
E signature pad not working in room, unable to obtain signature, pt verbalized understanding of d/c instructions

## 2017-07-22 NOTE — ED Notes (Signed)
Pt reported that she just got off her monthly menses yesterday and had blood work done in PCP in [place].  Instructed pt that we need one to document in chart that she is not pregnant before xrays can be done.  Also pt refused Vicodin, she stated that pain med probably will not work due to pt already on percocet.

## 2017-07-22 NOTE — ED Provider Notes (Signed)
[facility] Provider Note   CSN: [id number] Arrival date & time: 07/22/17  1244     History   Chief Complaint Chief Complaint  Patient presents with  . Hip Pain    HPI Caitlin Mcguire is a 36 y.o. female.  HPI   Caitlin Mcguire is a 36 y.o. female with hx of fibromyalgia, chronic back pain, DM, who presents to the Emergency Department complaining of left hip and low back pain.  She states her back pain is worse secondary to a fall down steps that occurred 2 weeks ago.  She is currently in pain management in [place], [place] and taking oxycodone and percocet which she states are not helping her pain.  Pain radiates from the hip and buttock and radiates to her leg.  Pain is worse with movement and weight bearing.  She denies fever, chills, abdominal pain, urine or bowel changes, and other injuries.  Past Medical History:  Diagnosis Date  . Diabetes mellitus without complication (HCC)   . Fibromyalgia   . Headache   . Hyperlipidemia   . Hypertension     Patient Active Problem List   Diagnosis Date Noted  . BACK PAIN 01/17/2008    History reviewed. No pertinent surgical history.  OB History    Gravida Para Term Preterm AB Living   7       3 2    SAB TAB Ectopic Multiple Live Births   3               Home Medications    Prior to Admission medications   Medication Sig Start Date End Date Taking? Authorizing Provider  ALPRAZolam (XANAX) 1 MG tablet TAKE 1 TABLET BY MOUTH THREE TIMES DAILY 06/04/15   [provider]  amphetamine-dextroamphetamine (ADDERALL) 30 MG tablet TAKE 1 TABLET BY MOUTH THREE TIMES DAILY 06/04/15   [provider]  butalbital-acetaminophen-caffeine (FIORICET, ESGIC) 50-325-40 MG tablet Take 1 tablet by mouth 2 (two) times daily as needed for headache.    [provider]  gabapentin (NEURONTIN) 300 MG capsule Take 300 mg by mouth 3 (three) times daily.    [provider]  glipiZIDE (GLUCOTROL) 10 MG tablet Take 10  mg by mouth daily before breakfast.    [provider]  lisinopril-hydrochlorothiazide (PRINZIDE,ZESTORETIC) 10-12.5 MG tablet Take 1 tablet by mouth daily.    [provider]  metoprolol succinate (TOPROL-XL) 25 MG 24 hr tablet Take 25 mg by mouth daily.    [provider]  ondansetron (ZOFRAN) 4 MG tablet Take 1 tablet (4 mg total) by mouth every 8 (eight) hours as needed for nausea or vomiting. 05/21/17   Samuel Jester, DO  oxyCODONE (ROXICODONE) 15 MG immediate release tablet Take 15 mg by mouth daily.    [provider]  oxyCODONE-acetaminophen (PERCOCET) 10-325 MG tablet Take 1 tablet by mouth 4 (four) times daily.    [provider]  pravastatin (PRAVACHOL) 20 MG tablet Take 60 mg by mouth at bedtime.     [provider]  tiZANidine (ZANAFLEX) 4 MG tablet Take 4 mg by mouth every 6 (six) hours as needed for muscle spasms.    [provider]    Family History History reviewed. No pertinent family history.  Social History Social History  Substance Use Topics  . Smoking status: Current Every Day Smoker    Packs/day: 0.50    Types: Cigarettes  . Smokeless tobacco: Never Used  . Alcohol use No  Allergies   Penicillins   Review of Systems Review of Systems  Constitutional: Negative for fever.  Respiratory: Negative for shortness of breath.   Cardiovascular: Negative for chest pain.  Gastrointestinal: Negative for abdominal pain, constipation and vomiting.  Genitourinary: Negative for decreased urine volume, difficulty urinating, dysuria, flank pain and hematuria.  Musculoskeletal: Positive for arthralgias (left hip pain) and back pain. Negative for joint swelling and neck pain.  Skin: Negative for rash.  Neurological: Negative for weakness and numbness.  All other systems reviewed and are negative.    Physical Exam Updated Vital Signs BP (!) 150/78 (BP Location: Left Arm)   Pulse 75   Temp 98.4 F  (36.9 C) (Oral)   Resp 20   Ht 5\' 2"  (1.575 m)   Wt 104.3 kg (230 lb)   LMP 03/07/2017   SpO2 98%   Breastfeeding? Unknown Comment: negative preg test  BMI 42.07 kg/m   Physical Exam  Constitutional: She is oriented to person, place, and time. She appears well-developed and well-nourished. No distress.  HENT:  Head: Normocephalic and atraumatic.  Neck: Normal range of motion. Neck supple.  Cardiovascular: Normal rate, regular rhythm and intact distal pulses.   Pulmonary/Chest: Effort normal and breath sounds normal. No respiratory distress.  Abdominal: Soft. She exhibits no distension. There is no tenderness.  Musculoskeletal: She exhibits tenderness. She exhibits no edema.       Lumbar back: She exhibits tenderness and pain. She exhibits normal range of motion, no swelling, no deformity, no laceration and normal pulse.  ttp of the lateral and posterior left hip, left SI joint and lumbar paraspinal muscles.  Pt has 5/5 strength against resistance of bilateral lower extremities.     Neurological: She is alert and oriented to person, place, and time. She has normal strength. No sensory deficit. She exhibits normal muscle tone. Coordination and gait normal.  Reflex Scores:      Patellar reflexes are 2+ on the right side and 2+ on the left side.      Achilles reflexes are 2+ on the right side and 2+ on the left side. Skin: Skin is warm and dry. Capillary refill takes less than 2 seconds. No rash noted.  Nursing note and vitals reviewed.    ED Treatments / Results  Labs (all labs ordered are listed, but only abnormal results are displayed) Labs Reviewed  POC URINE PREG, ED    EKG  EKG Interpretation None       Radiology Dg Lumbar Spine Complete  Result Date: 07/22/2017 CLINICAL DATA:  Fall, left hip and low back pain EXAM: LUMBAR SPINE - COMPLETE 4+ VIEW COMPARISON:  CT abdomen/ pelvis dated 09/03/2016 FINDINGS: Five lumbar-type vertebral bodies. Normal lumbar lordosis. No  evidence of fracture or dislocation. Vertebra body heights and intervertebral disc spaces are maintained. Visualized bony pelvis appears intact. IMPRESSION: Negative. Electronically Signed   By: Charline Bills M.D.   On: 07/22/2017 15:16   Dg Hip Unilat W Or Wo Pelvis 2-3 Views Left  Result Date: 07/22/2017 CLINICAL DATA:  Fall, left hip pain EXAM: DG HIP (WITH OR WITHOUT PELVIS) 2-3V LEFT COMPARISON:  None. FINDINGS: No fracture or dislocation is seen. The joint spaces are preserved. Visualized soft tissues are within normal limits. IMPRESSION: Negative. Electronically Signed   By: Charline Bills M.D.   On: 07/22/2017 15:16    Procedures Procedures (including critical care time)  Medications Ordered in ED Medications  ketorolac (TORADOL) injection 60 mg (60 mg Intramuscular Given 07/22/17  1424)     Initial Impression / Assessment and Plan / ED Course  I have reviewed the triage vital signs and the nursing notes.  Pertinent labs & imaging results that were available during my care of the patient were reviewed by me and considered in my medical decision making (see chart for details).     Patient reviewed on [place] narcotics database. She receives monthly prescriptions of present 10/325 and 15 mg oxycodone. Last filled on 07/14/2017. I have explained to patient that additional narcotic pain medication is not indicated at this time. She verbalized understanding. No concerning sx's for emergent neurological process.  We will try prednisone taper and she agrees to close PCP follow-up.  Final Clinical Impressions(s) / ED Diagnoses   Final diagnoses:  Acute left-sided low back pain with left-sided sciatica  Pain of left hip joint    New Prescriptions New Prescriptions   No medications on file     Rosey Bath 07/24/17 2101    Bethann Berkshire, MD 07/24/17 2122

## 2017-07-22 NOTE — ED Triage Notes (Signed)
Pt reports left hip pain and lower back pain after falling down steps 2 weeks ago.

## 2017-07-22 NOTE — Discharge Instructions (Signed)
Alternate ice and heat to your back. Call one of the providers listed to arrange a follow-up appointment

## 2017-07-24 ENCOUNTER — Emergency Department (HOSPITAL_COMMUNITY)
Admission: EM | Admit: 2017-07-24 | Discharge: 2017-07-25 | Disposition: A | Discharge status: Home / Self Care | Payer: Medicaid Other | Attending: Emergency Medicine | Admitting: Emergency Medicine

## 2017-07-24 ENCOUNTER — Encounter (HOSPITAL_COMMUNITY): Payer: Self-pay | Admitting: Emergency Medicine

## 2017-07-24 DIAGNOSIS — I10 Essential (primary) hypertension: Secondary | ICD-10-CM | POA: Diagnosis not present

## 2017-07-24 DIAGNOSIS — E119 Type 2 diabetes mellitus without complications: Secondary | ICD-10-CM | POA: Diagnosis not present

## 2017-07-24 DIAGNOSIS — M25552 Pain in left hip: Secondary | ICD-10-CM | POA: Diagnosis present

## 2017-07-24 DIAGNOSIS — Z79899 Other long term (current) drug therapy: Secondary | ICD-10-CM | POA: Insufficient documentation

## 2017-07-24 DIAGNOSIS — M79605 Pain in left leg: Secondary | ICD-10-CM | POA: Insufficient documentation

## 2017-07-24 DIAGNOSIS — M5432 Sciatica, left side: Secondary | ICD-10-CM | POA: Diagnosis not present

## 2017-07-24 DIAGNOSIS — F17210 Nicotine dependence, cigarettes, uncomplicated: Secondary | ICD-10-CM | POA: Diagnosis not present

## 2017-07-24 DIAGNOSIS — Z7984 Long term (current) use of oral hypoglycemic drugs: Secondary | ICD-10-CM | POA: Insufficient documentation

## 2017-07-24 MED ORDER — GABAPENTIN 300 MG PO CAPS
600.0000 mg | ORAL_CAPSULE | Freq: Once | ORAL | Status: AC
  Administered 2017-07-25: 600 mg via ORAL
  Filled 2017-07-24: qty 2

## 2017-07-24 MED ORDER — KETOROLAC TROMETHAMINE 30 MG/ML IJ SOLN
30.0000 mg | Freq: Once | INTRAMUSCULAR | Status: AC
  Administered 2017-07-25: 30 mg via INTRAMUSCULAR
  Filled 2017-07-24: qty 1

## 2017-07-24 NOTE — ED Provider Notes (Signed)
[facility]-EM[facility]Y DEPT Provider Note   CSN: [id number] Arrival date & time: 07/24/17  1709     History   Chief Complaint Chief Complaint  Patient presents with  . Leg Pain  . Hip Pain    HPI Caitlin Mcguire is a 36 y.o. female.  HPI  This is a 36 year old female with a history of diabetes, fibromyalgia, hypertension, hyperlipidemia who presents with left hip and leg pain. Patient was seen and evaluated for the same on Saturday. At that time she was given a Dosepak and told to follow-up with her primary physician. Patient reports progressive worsening of pain. She states that when she moves or tries to stand on her left leg she has 10 out of 10 pain.she states that the pain radiates downward. She also reports some numbness in the foot up to the knee. She does have a history of neuropathy but reports that this is worse.  When she is still pain is minimal. She takes oxycodone, Percocet, and Zanaflex at baseline for her fibromyalgia. These do not appear to be helping. She denies any weakness in the leg but reports significant pain with bearing weight. She reports that she's been unable to walk secondary to pain. She denies any bowel or bladder difficulty. She denies any history of cancer, IV drug use, fevers. She denies any trauma.  Past Medical History:  Diagnosis Date  . Diabetes mellitus without complication (HCC)   . Fibromyalgia   . Headache   . Hyperlipidemia   . Hypertension     Patient Active Problem List   Diagnosis Date Noted  . BACK PAIN 01/17/2008    History reviewed. No pertinent surgical history.  OB History    Gravida Para Term Preterm AB Living   7       3 2    SAB TAB Ectopic Multiple Live Births   3               Home Medications    Prior to Admission medications   Medication Sig Start Date End Date Taking? Authorizing Provider  ALPRAZolam (XANAX) 1 MG tablet TAKE 1 TABLET BY MOUTH THREE TIMES DAILY 06/04/15   [provider]    amphetamine-dextroamphetamine (ADDERALL) 30 MG tablet TAKE 1 TABLET BY MOUTH THREE TIMES DAILY 06/04/15   [provider]  butalbital-acetaminophen-caffeine (FIORICET, ESGIC) 50-325-40 MG tablet Take 1 tablet by mouth 2 (two) times daily as needed for headache.    [provider]  gabapentin (NEURONTIN) 300 MG capsule Take 300 mg by mouth 3 (three) times daily.    [provider]  glipiZIDE (GLUCOTROL) 10 MG tablet Take 10 mg by mouth daily before breakfast.    [provider]  lisinopril-hydrochlorothiazide (PRINZIDE,ZESTORETIC) 10-12.5 MG tablet Take 1 tablet by mouth daily.    [provider]  metoprolol succinate (TOPROL-XL) 25 MG 24 hr tablet Take 25 mg by mouth daily.    [provider]  ondansetron (ZOFRAN) 4 MG tablet Take 1 tablet (4 mg total) by mouth every 8 (eight) hours as needed for nausea or vomiting. 05/21/17   Samuel Jester, DO  oxyCODONE (ROXICODONE) 15 MG immediate release tablet Take 15 mg by mouth daily.    [provider]  oxyCODONE-acetaminophen (PERCOCET) 10-325 MG tablet Take 1 tablet by mouth 4 (four) times daily.    [provider]  pravastatin (PRAVACHOL) 20 MG tablet Take 60 mg by mouth at bedtime.     [provider]  predniSONE (DELTASONE) 10 MG tablet  Take 6 tablets day one, 5 tablets day two, 4 tablets day three, 3 tablets day four, 2 tablets day five, then 1 tablet day six 07/22/17   Triplett, Tammy, PA-C  tiZANidine (ZANAFLEX) 4 MG tablet Take 4 mg by mouth every 6 (six) hours as needed for muscle spasms.    [provider]    Family History No family history on file.  Social History Social History  Substance Use Topics  . Smoking status: Current Every Day Smoker    Packs/day: 0.50    Types: Cigarettes  . Smokeless tobacco: Never Used  . Alcohol use No     Allergies   Penicillins   Review of Systems Review of Systems  Constitutional: Negative for fever.   Genitourinary: Negative for dysuria and urgency.  Musculoskeletal:       Left back pain and leg pain  Neurological: Positive for numbness. Negative for weakness.  All other systems reviewed and are negative.    Physical Exam Updated Vital Signs BP (!) 146/93   Pulse 68   Temp 99 F (37.2 C) (Oral)   Resp 17   Ht 5\' 2"  (1.575 m)   Wt 104.3 kg (230 lb)   LMP 07/21/2017   SpO2 99%   BMI 42.07 kg/m   Physical Exam  Constitutional: She is oriented to person, place, and time. She appears well-developed and well-nourished.  Obese, no acute distress  HENT:  Head: Normocephalic and atraumatic.  Cardiovascular: Normal rate, regular rhythm and normal heart sounds.   Pulmonary/Chest: Effort normal and breath sounds normal. No respiratory distress. She has no wheezes.  Abdominal: Soft. There is no tenderness.  Musculoskeletal:  Normal range of motion of the left leg, no obvious deformities, tenderness to palpation over the left paraspinous muscle region of the lumbar spine, positive left straight leg raise  Neurological: She is alert and oriented to person, place, and time.  Equal reflexes bilaterally, no clonus, 4+ out of 5 strength with hip flexion, plantar and dorsiflexion on the left; however, she resisted with fairly good force the leg raise. Normal strength on the right  Skin: Skin is warm and dry.  Psychiatric: She has a normal mood and affect.  Nursing note and vitals reviewed.    ED Treatments / Results  Labs (all labs ordered are listed, but only abnormal results are displayed) Labs Reviewed  CBG MONITORING, ED    EKG  EKG Interpretation None       Radiology No results found.  Procedures Procedures (including critical care time)  Medications Ordered in ED Medications  ketorolac (TORADOL) 30 MG/ML injection 30 mg (30 mg Intramuscular Given 07/25/17 0019)  gabapentin (NEURONTIN) capsule 600 mg (600 mg Oral Given 07/25/17 0018)  dexamethasone (DECADRON)  injection 10 mg (10 mg Intramuscular Given 07/25/17 0150)  oxyCODONE-acetaminophen (PERCOCET/ROXICET) 5-325 MG per tablet 2 tablet (2 tablets Oral Given 07/25/17 0151)     Initial Impression / Assessment and Plan / ED Course  I have reviewed the triage vital signs and the nursing notes.  Pertinent labs & imaging results that were available during my care of the patient were reviewed by me and considered in my medical decision making (see chart for details).     Patient presents with persistent back pain and left leg pain. Denies weakness or neurologic deficit. Reports she is unable to walk secondary to pain. Vital signs reassuring. Neurologic exam is largely unremarkable. Question some effort secondary to pain. Her findings are consistent with sciatica. She is  on a very robust narcotic regimen at home. She is currently on steroids and gabapentin as well.  Patient given Toradol and an increased dose of Neurontin here as well as oxycodone. On reevaluation she is able to ambulate independently to the counter although she still reports pain. I discussed with her that she given her current narcotic regimen, I do not feel like further narcotic medication will be helpful. She likely needs physical therapy and good anti-inflammatory medication. She was given an additional dose of Decadron. She can also add ibuprofen at home. Follow-up with primary physician has referred during prior evaluation. She was also given her surgical follow-up if symptoms do not improve. She was given strict return precautions.  After history, exam, and medical workup I feel the patient has been appropriately medically screened and is safe for discharge home. Pertinent diagnoses were discussed with the patient. Patient was given return precautions.   Final Clinical Impressions(s) / ED Diagnoses   Final diagnoses:  Sciatica of left side    New Prescriptions New Prescriptions   No medications on file     Shon Baton,  MD 07/25/17 [phone]

## 2017-07-24 NOTE — ED Triage Notes (Signed)
Patient states she was seen last Saturday for lower back pain that radiated down to left knee. Patient states now the pain has in creased and her leg in numb from knee down to foot. She states she can not walk now.

## 2017-07-25 LAB — CBG MONITORING, ED: Glucose-Capillary: 83 mg/dL (ref 65–99)

## 2017-07-25 MED ORDER — DEXAMETHASONE SODIUM PHOSPHATE 4 MG/ML IJ SOLN
10.0000 mg | Freq: Once | INTRAMUSCULAR | Status: AC
  Administered 2017-07-25: 10 mg via INTRAMUSCULAR
  Filled 2017-07-25 (×2): qty 3

## 2017-07-25 MED ORDER — DEXAMETHASONE SODIUM PHOSPHATE 4 MG/ML IJ SOLN: 10.0000 mg | Freq: Once | INTRAMUSCULAR | Status: DC

## 2017-07-25 MED ORDER — OXYCODONE-ACETAMINOPHEN 5-325 MG PO TABS
2.0000 | ORAL_TABLET | Freq: Once | ORAL | Status: AC
  Administered 2017-07-25: 2 via ORAL
  Filled 2017-07-25: qty 2

## 2017-07-25 NOTE — ED Notes (Signed)
Pt significant other given 4 packs of peanut butter and 5 packs of graham crackers per request. And beverage

## 2017-07-25 NOTE — ED Notes (Signed)
Pt ambulatory to nurses station to sign e-sig.

## 2017-07-25 NOTE — ED Notes (Signed)
Attempted to ambulate pt - pt able to stand and bear weight with standby assist, but starts crying after taking one step with right foot and states, "I just can't do it". Pt significant other at bedside requesting a different pain medication for pt, saying the Toradol hasn't worked. Pt significant other requesting food and drink for himself-informed I will get him something after talking with Dr Wilkie Aye about the patient.

## 2017-07-25 NOTE — Discharge Instructions (Signed)
You were seen today for worsening left leg pain. This is likely related to sciatica. This can be related to nerve inflammation. Continue your pain medication regimen at home. Add ibuprofen for anti-inflammation. Follow-up primary physician for physical therapy referral.  Neurosurgery referral with Dr. Bevely Palmer above if you acutely worsen. If you develop weakness, numbness, or difficulty with her bowel or bladder you should be reevaluated immediately.

## 2017-08-30 ENCOUNTER — Other Ambulatory Visit: Payer: Self-pay | Admitting: Physician Assistant

## 2017-08-30 ENCOUNTER — Other Ambulatory Visit (HOSPITAL_COMMUNITY): Payer: Self-pay | Admitting: Physician Assistant

## 2017-08-30 DIAGNOSIS — M5432 Sciatica, left side: Secondary | ICD-10-CM

## 2017-08-31 ENCOUNTER — Ambulatory Visit (HOSPITAL_COMMUNITY)
Admission: RE | Admit: 2017-08-31 | Discharge: 2017-08-31 | Disposition: A | Discharge status: Home / Self Care | Payer: Medicaid Other | Source: Ambulatory Visit | Attending: Physician Assistant | Admitting: Physician Assistant

## 2017-08-31 DIAGNOSIS — M5126 Other intervertebral disc displacement, lumbar region: Secondary | ICD-10-CM | POA: Insufficient documentation

## 2017-08-31 DIAGNOSIS — M5124 Other intervertebral disc displacement, thoracic region: Secondary | ICD-10-CM | POA: Diagnosis not present

## 2017-08-31 DIAGNOSIS — M48061 Spinal stenosis, lumbar region without neurogenic claudication: Secondary | ICD-10-CM | POA: Insufficient documentation

## 2017-08-31 DIAGNOSIS — M5432 Sciatica, left side: Secondary | ICD-10-CM | POA: Insufficient documentation

## 2017-08-31 DIAGNOSIS — M4804 Spinal stenosis, thoracic region: Secondary | ICD-10-CM | POA: Diagnosis not present

## 2017-08-31 DIAGNOSIS — M5127 Other intervertebral disc displacement, lumbosacral region: Secondary | ICD-10-CM | POA: Diagnosis not present

## 2017-09-21 ENCOUNTER — Other Ambulatory Visit: Payer: Self-pay | Admitting: Neurological Surgery

## 2017-09-27 NOTE — Pre-Procedure Instructions (Signed)
Lauri R Wollin  09/27/2017      [facility] [id number] - [place], [place] - [address] [place] [address] Phone: [phone] Fax: [phone]  [facility] [address] - [place], [place] - [address] [place] [address] Phone: [phone] Fax: [phone]  [facility] [address] - [place], [place] - [address] [place] [address] [id number] [place] [address] [place] [address] Phone: [phone] Fax: [phone]    Your procedure is scheduled on November 5  [facility]  Call this number if you have problems the morning of surgery:  [phone]   Remember:  Do not eat food or drink liquids after midnight.  Continue all other medications as directed by your physician except follow these medication instructions before surgery   Take these medicines the morning of surgery with A SIP OF WATER  albuterol (PROVENTIL HFA;VENTOLIN HFA) ALPRAZolam (XANAX) gabapentin (NEURONTIN)  metoprolol succinate (TOPROL-XL) take the night before surgery if that is when you  normally take it oxyCODONE-acetaminophen (PERCOCET)  7 days prior to surgery STOP taking any Aspirin (unless otherwise instructed by your surgeon), Aleve, Naproxen, Ibuprofen, Motrin, Advil, Goody's, BC's, all herbal medications, fish oil, and all vitamins    Do not wear jewelry, make-up or nail polish.  Do not wear lotions, powders, or perfumes, or deoderant.  Do not shave 48 hours prior to surgery.   Do not bring valuables to the hospital.  [facility] is not responsible for any belongings or valuables.  Contacts, dentures or bridgework may not be worn into surgery.  Leave your suitcase in the car.  After surgery it may be brought to your room.  For patients admitted to the hospital, discharge time will be determined by your treatment team.  Patients discharged the day of surgery will not be allowed to drive  home.    Special instructions:   [facility]- Preparing For Surgery  Before surgery, you can play an important role. Because skin is not sterile, your skin needs to be as free of germs as possible. You can reduce the number of germs on your skin by washing with CHG (chlorahexidine gluconate) Soap before surgery.  CHG is an antiseptic cleaner which kills germs and bonds with the skin to continue killing germs even after washing.  Please do not use if you have an allergy to CHG or antibacterial soaps. If your skin becomes reddened/irritated stop using the CHG.  Do not shave (including legs and underarms) for at least 48 hours prior to first CHG shower. It is OK to shave your face.  Please follow these instructions carefully.   1. Shower the NIGHT BEFORE SURGERY and the MORNING OF SURGERY with CHG.   2. If you chose to wash your hair, wash your hair first as usual with your normal shampoo.  3. After you shampoo, rinse your hair and body thoroughly to remove the shampoo.  4. Use CHG as you would any other liquid soap. You can apply CHG directly to the skin and wash gently with a scrungie or a clean washcloth.   5. Apply the CHG Soap to your body ONLY FROM THE NECK DOWN.  Do not use on open wounds or open sores. Avoid contact with your eyes, ears, mouth and genitals (private parts). Wash Face and genitals (private parts)  with your normal soap.  6. Wash  thoroughly, paying special attention to the area where your surgery will be performed.  7. Thoroughly rinse your body with warm water from the neck down.  8. DO NOT shower/wash with your normal soap after using and rinsing off the CHG Soap.  9. Pat yourself dry with a CLEAN TOWEL.  10. Wear CLEAN PAJAMAS to bed the night before surgery, wear comfortable clothes the morning of surgery  11. Place CLEAN SHEETS on your bed the night of your first shower and DO NOT SLEEP WITH PETS.    Day of Surgery: Do not apply any deodorants/lotions.  Please wear clean clothes to the hospital/surgery center.     Please read over the following fact sheets that you were given.

## 2017-09-28 ENCOUNTER — Encounter (HOSPITAL_COMMUNITY): Payer: Self-pay

## 2017-09-28 ENCOUNTER — Encounter (HOSPITAL_COMMUNITY)
Admission: RE | Admit: 2017-09-28 | Discharge: 2017-09-28 | Disposition: A | Discharge status: Home / Self Care | Payer: Medicaid Other | Source: Ambulatory Visit | Attending: Neurological Surgery | Admitting: Neurological Surgery

## 2017-09-28 DIAGNOSIS — Z01818 Encounter for other preprocedural examination: Secondary | ICD-10-CM | POA: Diagnosis not present

## 2017-09-28 DIAGNOSIS — I10 Essential (primary) hypertension: Secondary | ICD-10-CM | POA: Diagnosis not present

## 2017-09-28 DIAGNOSIS — E119 Type 2 diabetes mellitus without complications: Secondary | ICD-10-CM | POA: Diagnosis not present

## 2017-09-28 HISTORY — DX: Personal history of urinary calculi: Z87.442

## 2017-09-28 HISTORY — DX: Personal history of other diseases of the respiratory system: Z87.09

## 2017-09-28 HISTORY — DX: Gastro-esophageal reflux disease without esophagitis: K21.9

## 2017-09-28 HISTORY — DX: Anxiety disorder, unspecified: F41.9

## 2017-09-28 LAB — SURGICAL PCR SCREEN
MRSA, PCR: NEGATIVE
STAPHYLOCOCCUS AUREUS: NEGATIVE

## 2017-09-28 LAB — CBC
HEMATOCRIT: 40.8 % (ref 36.0–46.0)
Hemoglobin: 14.2 g/dL (ref 12.0–15.0)
MCH: 33 pg (ref 26.0–34.0)
MCHC: 34.8 g/dL (ref 30.0–36.0)
MCV: 94.9 fL (ref 78.0–100.0)
Platelets: 149 10*3/uL — ABNORMAL LOW (ref 150–400)
RBC: 4.3 MIL/uL (ref 3.87–5.11)
RDW: 13.2 % (ref 11.5–15.5)
WBC: 6.1 10*3/uL (ref 4.0–10.5)

## 2017-09-28 LAB — HEMOGLOBIN A1C
HEMOGLOBIN A1C: 6.4 % — AB (ref 4.8–5.6)
MEAN PLASMA GLUCOSE: 136.98 mg/dL

## 2017-09-28 LAB — BASIC METABOLIC PANEL
Anion gap: 7 (ref 5–15)
BUN: 7 mg/dL (ref 6–20)
CHLORIDE: 107 mmol/L (ref 101–111)
CO2: 24 mmol/L (ref 22–32)
CREATININE: 0.75 mg/dL (ref 0.44–1.00)
Calcium: 8.9 mg/dL (ref 8.9–10.3)
GFR calc Af Amer: >60 mL/min (ref >60)
GFR calc non Af Amer: >60 mL/min (ref >60)
Glucose, Bld: 115 mg/dL — ABNORMAL HIGH (ref 65–99)
POTASSIUM: 4.9 mmol/L (ref 3.5–5.1)
Sodium: 138 mmol/L (ref 135–145)

## 2017-09-28 LAB — HCG, SERUM, QUALITATIVE: PREG SERUM: NEGATIVE

## 2017-09-28 LAB — GLUCOSE, CAPILLARY: GLUCOSE-CAPILLARY: 121 mg/dL — AB (ref 65–99)

## 2017-09-29 MED ORDER — VANCOMYCIN HCL 10 G IV SOLR
1500.0000 mg | INTRAVENOUS | Status: DC
  Filled 2017-09-29: qty 1500

## 2017-10-02 ENCOUNTER — Ambulatory Visit (HOSPITAL_COMMUNITY): Payer: Medicaid Other | Admitting: Certified Registered Nurse Anesthetist

## 2017-10-02 ENCOUNTER — Encounter (HOSPITAL_COMMUNITY)
Admission: RE | Disposition: A | Discharge status: Home / Self Care | Payer: Self-pay | Source: Ambulatory Visit | Attending: Neurological Surgery

## 2017-10-02 ENCOUNTER — Ambulatory Visit (HOSPITAL_COMMUNITY): Payer: Medicaid Other

## 2017-10-02 ENCOUNTER — Inpatient Hospital Stay (HOSPITAL_COMMUNITY)
Admission: RE | Admit: 2017-10-02 | Discharge: 2017-10-04 | DRG: 519 | Disposition: A | Discharge status: Home / Self Care | Payer: Medicaid Other | Source: Ambulatory Visit | Attending: Neurological Surgery | Admitting: Neurological Surgery

## 2017-10-02 DIAGNOSIS — Z7984 Long term (current) use of oral hypoglycemic drugs: Secondary | ICD-10-CM

## 2017-10-02 DIAGNOSIS — Y92234 Operating room of hospital as the place of occurrence of the external cause: Secondary | ICD-10-CM | POA: Diagnosis not present

## 2017-10-02 DIAGNOSIS — Y753 Surgical instruments, materials and neurological devices (including sutures) associated with adverse incidents: Secondary | ICD-10-CM | POA: Diagnosis not present

## 2017-10-02 DIAGNOSIS — G960 Cerebrospinal fluid leak: Secondary | ICD-10-CM | POA: Diagnosis not present

## 2017-10-02 DIAGNOSIS — Y838 Other surgical procedures as the cause of abnormal reaction of the patient, or of later complication, without mention of misadventure at the time of the procedure: Secondary | ICD-10-CM | POA: Diagnosis not present

## 2017-10-02 DIAGNOSIS — F17210 Nicotine dependence, cigarettes, uncomplicated: Secondary | ICD-10-CM | POA: Diagnosis present

## 2017-10-02 DIAGNOSIS — M21372 Foot drop, left foot: Secondary | ICD-10-CM | POA: Diagnosis present

## 2017-10-02 DIAGNOSIS — M797 Fibromyalgia: Secondary | ICD-10-CM | POA: Diagnosis present

## 2017-10-02 DIAGNOSIS — Z87442 Personal history of urinary calculi: Secondary | ICD-10-CM

## 2017-10-02 DIAGNOSIS — I10 Essential (primary) hypertension: Secondary | ICD-10-CM | POA: Diagnosis present

## 2017-10-02 DIAGNOSIS — Z880 Allergy status to penicillin: Secondary | ICD-10-CM

## 2017-10-02 DIAGNOSIS — Z6841 Body Mass Index (BMI) 40.0 and over, adult: Secondary | ICD-10-CM

## 2017-10-02 DIAGNOSIS — M5116 Intervertebral disc disorders with radiculopathy, lumbar region: Principal | ICD-10-CM | POA: Diagnosis present

## 2017-10-02 DIAGNOSIS — E119 Type 2 diabetes mellitus without complications: Secondary | ICD-10-CM | POA: Diagnosis present

## 2017-10-02 DIAGNOSIS — G9781 Other intraoperative complications of nervous system: Secondary | ICD-10-CM | POA: Diagnosis not present

## 2017-10-02 DIAGNOSIS — F419 Anxiety disorder, unspecified: Secondary | ICD-10-CM | POA: Diagnosis present

## 2017-10-02 DIAGNOSIS — K219 Gastro-esophageal reflux disease without esophagitis: Secondary | ICD-10-CM | POA: Diagnosis present

## 2017-10-02 DIAGNOSIS — Z79891 Long term (current) use of opiate analgesic: Secondary | ICD-10-CM

## 2017-10-02 DIAGNOSIS — E785 Hyperlipidemia, unspecified: Secondary | ICD-10-CM | POA: Diagnosis present

## 2017-10-02 DIAGNOSIS — M5126 Other intervertebral disc displacement, lumbar region: Secondary | ICD-10-CM

## 2017-10-02 DIAGNOSIS — Z79899 Other long term (current) drug therapy: Secondary | ICD-10-CM

## 2017-10-02 DIAGNOSIS — Z791 Long term (current) use of non-steroidal anti-inflammatories (NSAID): Secondary | ICD-10-CM

## 2017-10-02 HISTORY — PX: LUMBAR LAMINECTOMY/DECOMPRESSION MICRODISCECTOMY: SHX5026

## 2017-10-02 LAB — GLUCOSE, CAPILLARY
GLUCOSE-CAPILLARY: 119 mg/dL — AB (ref 65–99)
GLUCOSE-CAPILLARY: 95 mg/dL (ref 65–99)
Glucose-Capillary: 129 mg/dL — ABNORMAL HIGH (ref 65–99)

## 2017-10-02 SURGERY — LUMBAR LAMINECTOMY/DECOMPRESSION MICRODISCECTOMY 1 LEVEL
Anesthesia: General | Laterality: Left

## 2017-10-02 MED ORDER — LIDOCAINE-EPINEPHRINE 2 %-1:100000 IJ SOLN
INTRAMUSCULAR | Status: DC | PRN
  Administered 2017-10-02: 10 mL

## 2017-10-02 MED ORDER — ALUM & MAG HYDROXIDE-SIMETH 200-200-20 MG/5ML PO SUSP: 30.0000 mL | Freq: Four times a day (QID) | ORAL | Status: DC | PRN

## 2017-10-02 MED ORDER — GLIPIZIDE ER 10 MG PO TB24
10.0000 mg | ORAL_TABLET | Freq: Every day | ORAL | Status: DC
  Administered 2017-10-03 – 2017-10-04 (×2): 10 mg via ORAL
  Filled 2017-10-02 (×2): qty 1

## 2017-10-02 MED ORDER — 0.9 % SODIUM CHLORIDE (POUR BTL) OPTIME
TOPICAL | Status: DC | PRN
  Administered 2017-10-02: 1000 mL

## 2017-10-02 MED ORDER — ROCURONIUM BROMIDE 100 MG/10ML IV SOLN
INTRAVENOUS | Status: DC | PRN
  Administered 2017-10-02: 50 mg via INTRAVENOUS
  Administered 2017-10-02 (×2): 20 mg via INTRAVENOUS

## 2017-10-02 MED ORDER — LISINOPRIL-HYDROCHLOROTHIAZIDE 10-12.5 MG PO TABS: 1.0000 | ORAL_TABLET | Freq: Every day | ORAL | Status: DC

## 2017-10-02 MED ORDER — SODIUM CHLORIDE 0.9 % IV SOLN: 250.0000 mL | INTRAVENOUS | Status: DC

## 2017-10-02 MED ORDER — SODIUM CHLORIDE 0.9% FLUSH: 3.0000 mL | INTRAVENOUS | Status: DC | PRN

## 2017-10-02 MED ORDER — METOPROLOL SUCCINATE ER 25 MG PO TB24
25.0000 mg | ORAL_TABLET | Freq: Every day | ORAL | Status: DC
  Administered 2017-10-02 – 2017-10-03 (×2): 25 mg via ORAL
  Filled 2017-10-02 (×2): qty 1

## 2017-10-02 MED ORDER — PHENYLEPHRINE 40 MCG/ML (10ML) SYRINGE FOR IV PUSH (FOR BLOOD PRESSURE SUPPORT)
PREFILLED_SYRINGE | INTRAVENOUS | Status: AC
  Filled 2017-10-02: qty 10

## 2017-10-02 MED ORDER — SODIUM CHLORIDE 0.9% FLUSH
3.0000 mL | Freq: Two times a day (BID) | INTRAVENOUS | Status: DC
  Administered 2017-10-02 – 2017-10-03 (×2): 3 mL via INTRAVENOUS

## 2017-10-02 MED ORDER — BUPIVACAINE-EPINEPHRINE (PF) 0.5% -1:200000 IJ SOLN
INTRAMUSCULAR | Status: AC
  Filled 2017-10-02: qty 30

## 2017-10-02 MED ORDER — MENTHOL 3 MG MT LOZG: 1.0000 | LOZENGE | OROMUCOSAL | Status: DC | PRN

## 2017-10-02 MED ORDER — LACTATED RINGERS IV SOLN
INTRAVENOUS | Status: DC
  Administered 2017-10-02: 50 mL/h via INTRAVENOUS

## 2017-10-02 MED ORDER — OXYCODONE HCL ER 20 MG PO T12A
20.0000 mg | EXTENDED_RELEASE_TABLET | Freq: Two times a day (BID) | ORAL | Status: DC
  Administered 2017-10-02 – 2017-10-04 (×4): 20 mg via ORAL
  Filled 2017-10-02 (×4): qty 1

## 2017-10-02 MED ORDER — ONDANSETRON HCL 4 MG/2ML IJ SOLN
INTRAMUSCULAR | Status: DC | PRN
  Administered 2017-10-02: 4 mg via INTRAVENOUS

## 2017-10-02 MED ORDER — OXYCODONE HCL 5 MG PO TABS: 5.0000 mg | ORAL_TABLET | ORAL | Status: DC | PRN

## 2017-10-02 MED ORDER — CHLORHEXIDINE GLUCONATE CLOTH 2 % EX PADS: 6.0000 | MEDICATED_PAD | Freq: Once | CUTANEOUS | Status: DC

## 2017-10-02 MED ORDER — PHENYLEPHRINE HCL 10 MG/ML IJ SOLN
INTRAMUSCULAR | Status: DC | PRN
  Administered 2017-10-02 (×3): 80 ug via INTRAVENOUS

## 2017-10-02 MED ORDER — EPHEDRINE 5 MG/ML INJ
INTRAVENOUS | Status: AC
  Filled 2017-10-02: qty 10

## 2017-10-02 MED ORDER — CELECOXIB 200 MG PO CAPS
200.0000 mg | ORAL_CAPSULE | Freq: Two times a day (BID) | ORAL | Status: DC
  Administered 2017-10-02 – 2017-10-04 (×4): 200 mg via ORAL
  Filled 2017-10-02 (×4): qty 1

## 2017-10-02 MED ORDER — VANCOMYCIN HCL 10 G IV SOLR
1250.0000 mg | Freq: Once | INTRAVENOUS | Status: AC
  Administered 2017-10-03: 1250 mg via INTRAVENOUS
  Filled 2017-10-02: qty 1250

## 2017-10-02 MED ORDER — SENNA 8.6 MG PO TABS
1.0000 | ORAL_TABLET | Freq: Two times a day (BID) | ORAL | Status: DC
  Administered 2017-10-02 – 2017-10-04 (×4): 8.6 mg via ORAL
  Filled 2017-10-02 (×4): qty 1

## 2017-10-02 MED ORDER — ONDANSETRON HCL 4 MG PO TABS
4.0000 mg | ORAL_TABLET | Freq: Four times a day (QID) | ORAL | Status: DC | PRN
  Administered 2017-10-02 – 2017-10-04 (×2): 4 mg via ORAL
  Filled 2017-10-02 (×2): qty 1

## 2017-10-02 MED ORDER — DIAZEPAM 5 MG PO TABS
ORAL_TABLET | ORAL | Status: AC
  Filled 2017-10-02: qty 1

## 2017-10-02 MED ORDER — BUPIVACAINE HCL (PF) 0.25 % IJ SOLN
INTRAMUSCULAR | Status: AC
  Filled 2017-10-02: qty 30

## 2017-10-02 MED ORDER — PHENOL 1.4 % MT LIQD: 1.0000 | OROMUCOSAL | Status: DC | PRN

## 2017-10-02 MED ORDER — MIDAZOLAM HCL 5 MG/5ML IJ SOLN
INTRAMUSCULAR | Status: DC | PRN
  Administered 2017-10-02: 2 mg via INTRAVENOUS

## 2017-10-02 MED ORDER — BUPIVACAINE LIPOSOME 1.3 % IJ SUSP
20.0000 mL | Freq: Once | INTRAMUSCULAR | Status: DC
  Filled 2017-10-02: qty 20

## 2017-10-02 MED ORDER — POTASSIUM CHLORIDE IN NACL 20-0.9 MEQ/L-% IV SOLN
100.0000 mL/h | INTRAVENOUS | Status: DC
  Administered 2017-10-02 – 2017-10-03 (×2): 100 mL/h via INTRAVENOUS
  Filled 2017-10-02 (×2): qty 1000

## 2017-10-02 MED ORDER — PROPOFOL 10 MG/ML IV BOLUS
INTRAVENOUS | Status: AC
  Filled 2017-10-02: qty 20

## 2017-10-02 MED ORDER — THROMBIN (RECOMBINANT) 5000 UNITS EX SOLR
CUTANEOUS | Status: AC
  Filled 2017-10-02: qty 5000

## 2017-10-02 MED ORDER — DOCUSATE SODIUM 100 MG PO CAPS
100.0000 mg | ORAL_CAPSULE | Freq: Two times a day (BID) | ORAL | Status: DC
  Administered 2017-10-02 – 2017-10-04 (×4): 100 mg via ORAL
  Filled 2017-10-02 (×4): qty 1

## 2017-10-02 MED ORDER — BISACODYL 10 MG RE SUPP: 10.0000 mg | Freq: Every day | RECTAL | Status: DC | PRN

## 2017-10-02 MED ORDER — ALBUTEROL SULFATE (2.5 MG/3ML) 0.083% IN NEBU: 3.0000 mL | INHALATION_SOLUTION | RESPIRATORY_TRACT | Status: DC | PRN

## 2017-10-02 MED ORDER — ALPRAZOLAM 0.5 MG PO TABS
1.0000 mg | ORAL_TABLET | Freq: Three times a day (TID) | ORAL | Status: DC | PRN
  Administered 2017-10-02 – 2017-10-04 (×5): 1 mg via ORAL
  Filled 2017-10-02 (×5): qty 2

## 2017-10-02 MED ORDER — MIDAZOLAM HCL 2 MG/2ML IJ SOLN
INTRAMUSCULAR | Status: AC
  Filled 2017-10-02: qty 2

## 2017-10-02 MED ORDER — BACITRACIN 50000 UNITS IM SOLR
INTRAMUSCULAR | Status: DC | PRN
  Administered 2017-10-02: 15:00:00

## 2017-10-02 MED ORDER — LIDOCAINE HCL (CARDIAC) 20 MG/ML IV SOLN
INTRAVENOUS | Status: DC | PRN
  Administered 2017-10-02: 60 mg via INTRAVENOUS

## 2017-10-02 MED ORDER — PRAVASTATIN SODIUM 40 MG PO TABS
60.0000 mg | ORAL_TABLET | Freq: Every day | ORAL | Status: DC
  Administered 2017-10-02 – 2017-10-03 (×2): 60 mg via ORAL
  Filled 2017-10-02 (×2): qty 1

## 2017-10-02 MED ORDER — HYDROCHLOROTHIAZIDE 12.5 MG PO CAPS
12.5000 mg | ORAL_CAPSULE | Freq: Every day | ORAL | Status: DC
  Administered 2017-10-02 – 2017-10-04 (×3): 12.5 mg via ORAL
  Filled 2017-10-02 (×3): qty 1

## 2017-10-02 MED ORDER — THROMBIN (RECOMBINANT) 5000 UNITS EX SOLR
OROMUCOSAL | Status: DC | PRN
  Administered 2017-10-02: 15:00:00 via TOPICAL

## 2017-10-02 MED ORDER — ONDANSETRON HCL 4 MG/2ML IJ SOLN
4.0000 mg | Freq: Four times a day (QID) | INTRAMUSCULAR | Status: DC | PRN
  Administered 2017-10-03: 4 mg via INTRAVENOUS
  Filled 2017-10-02: qty 2

## 2017-10-02 MED ORDER — PANTOPRAZOLE SODIUM 40 MG IV SOLR
40.0000 mg | Freq: Every day | INTRAVENOUS | Status: DC
  Administered 2017-10-02: 40 mg via INTRAVENOUS
  Filled 2017-10-02: qty 40

## 2017-10-02 MED ORDER — HYDROMORPHONE HCL 1 MG/ML IJ SOLN
INTRAMUSCULAR | Status: AC
  Filled 2017-10-02: qty 1

## 2017-10-02 MED ORDER — VANCOMYCIN HCL 1000 MG IV SOLR
INTRAVENOUS | Status: DC | PRN
  Administered 2017-10-02: 1500 mg via INTRAVENOUS

## 2017-10-02 MED ORDER — FENTANYL CITRATE (PF) 100 MCG/2ML IJ SOLN
INTRAMUSCULAR | Status: DC | PRN
  Administered 2017-10-02: 50 ug via INTRAVENOUS
  Administered 2017-10-02 (×2): 100 ug via INTRAVENOUS

## 2017-10-02 MED ORDER — PHENYLEPHRINE HCL 10 MG/ML IJ SOLN
INTRAVENOUS | Status: DC | PRN
  Administered 2017-10-02: 30 ug/min via INTRAVENOUS

## 2017-10-02 MED ORDER — SUGAMMADEX SODIUM 500 MG/5ML IV SOLN
INTRAVENOUS | Status: DC | PRN
  Administered 2017-10-02: 300 mg via INTRAVENOUS

## 2017-10-02 MED ORDER — GABAPENTIN 400 MG PO CAPS
800.0000 mg | ORAL_CAPSULE | Freq: Three times a day (TID) | ORAL | Status: DC
  Administered 2017-10-02 – 2017-10-04 (×5): 800 mg via ORAL
  Filled 2017-10-02 (×5): qty 2

## 2017-10-02 MED ORDER — DEXAMETHASONE SODIUM PHOSPHATE 10 MG/ML IJ SOLN
INTRAMUSCULAR | Status: DC | PRN
  Administered 2017-10-02: 4 mg via INTRAVENOUS

## 2017-10-02 MED ORDER — BUPIVACAINE-EPINEPHRINE (PF) 0.5% -1:200000 IJ SOLN
INTRAMUSCULAR | Status: DC | PRN
  Administered 2017-10-02: 10 mL via PERINEURAL

## 2017-10-02 MED ORDER — KETOROLAC TROMETHAMINE 30 MG/ML IJ SOLN
INTRAMUSCULAR | Status: AC
  Filled 2017-10-02: qty 1

## 2017-10-02 MED ORDER — METHYLPREDNISOLONE ACETATE 80 MG/ML IJ SUSP
INTRAMUSCULAR | Status: AC
  Filled 2017-10-02: qty 1

## 2017-10-02 MED ORDER — LISINOPRIL 10 MG PO TABS
10.0000 mg | ORAL_TABLET | Freq: Every day | ORAL | Status: DC
  Administered 2017-10-02 – 2017-10-04 (×3): 10 mg via ORAL
  Filled 2017-10-02 (×3): qty 1

## 2017-10-02 MED ORDER — ROCURONIUM BROMIDE 10 MG/ML (PF) SYRINGE
PREFILLED_SYRINGE | INTRAVENOUS | Status: AC
  Filled 2017-10-02: qty 5

## 2017-10-02 MED ORDER — FLEET ENEMA 7-19 GM/118ML RE ENEM: 1.0000 | ENEMA | Freq: Once | RECTAL | Status: DC | PRN

## 2017-10-02 MED ORDER — HEMOSTATIC AGENTS (NO CHARGE) OPTIME
TOPICAL | Status: DC | PRN
  Administered 2017-10-02: 1 via TOPICAL

## 2017-10-02 MED ORDER — DIAZEPAM 5 MG PO TABS
5.0000 mg | ORAL_TABLET | Freq: Four times a day (QID) | ORAL | Status: DC | PRN
  Administered 2017-10-02 – 2017-10-04 (×3): 5 mg via ORAL
  Filled 2017-10-02 (×3): qty 1

## 2017-10-02 MED ORDER — BUPIVACAINE HCL (PF) 0.25 % IJ SOLN
INTRAMUSCULAR | Status: DC | PRN
  Administered 2017-10-02: 18 mL

## 2017-10-02 MED ORDER — BUTALBITAL-APAP-CAFFEINE 50-325-40 MG PO TABS
1.0000 | ORAL_TABLET | Freq: Four times a day (QID) | ORAL | Status: DC | PRN
  Administered 2017-10-03: 1 via ORAL
  Filled 2017-10-02: qty 1

## 2017-10-02 MED ORDER — ACETAMINOPHEN 500 MG PO TABS
1000.0000 mg | ORAL_TABLET | Freq: Four times a day (QID) | ORAL | Status: DC
  Administered 2017-10-02 – 2017-10-04 (×6): 1000 mg via ORAL
  Filled 2017-10-02 (×8): qty 2

## 2017-10-02 MED ORDER — METOPROLOL SUCCINATE ER 25 MG PO TB24
25.0000 mg | ORAL_TABLET | Freq: Every day | ORAL | Status: DC
  Administered 2017-10-02: 25 mg via ORAL
  Filled 2017-10-02: qty 1

## 2017-10-02 MED ORDER — FENTANYL CITRATE (PF) 250 MCG/5ML IJ SOLN
INTRAMUSCULAR | Status: AC
  Filled 2017-10-02: qty 5

## 2017-10-02 MED ORDER — AMPHETAMINE-DEXTROAMPHETAMINE 10 MG PO TABS: 30.0000 mg | ORAL_TABLET | Freq: Three times a day (TID) | ORAL | Status: DC

## 2017-10-02 MED ORDER — METHOCARBAMOL 500 MG PO TABS
750.0000 mg | ORAL_TABLET | Freq: Four times a day (QID) | ORAL | Status: DC
Start: 2017-10-02 — End: 2017-10-04
  Administered 2017-10-02 – 2017-10-04 (×6): 750 mg via ORAL
  Filled 2017-10-02: qty 1.5
  Filled 2017-10-02: qty 2
  Filled 2017-10-02: qty 1.5
  Filled 2017-10-02 (×3): qty 2

## 2017-10-02 MED ORDER — HYDROMORPHONE HCL 1 MG/ML IJ SOLN
0.2500 mg | INTRAMUSCULAR | Status: DC | PRN
  Administered 2017-10-02 (×4): 0.5 mg via INTRAVENOUS

## 2017-10-02 MED ORDER — PROPOFOL 10 MG/ML IV BOLUS
INTRAVENOUS | Status: DC | PRN
  Administered 2017-10-02: 150 mg via INTRAVENOUS

## 2017-10-02 MED ORDER — LIDOCAINE-EPINEPHRINE 2 %-1:100000 IJ SOLN
INTRAMUSCULAR | Status: AC
  Filled 2017-10-02: qty 1

## 2017-10-02 MED ORDER — OXYCODONE HCL 5 MG PO TABS
10.0000 mg | ORAL_TABLET | ORAL | Status: DC | PRN
  Administered 2017-10-03 – 2017-10-04 (×6): 10 mg via ORAL
  Filled 2017-10-02 (×6): qty 2

## 2017-10-02 MED ORDER — SUGAMMADEX SODIUM 500 MG/5ML IV SOLN
INTRAVENOUS | Status: AC
  Filled 2017-10-02: qty 5

## 2017-10-02 SURGICAL SUPPLY — 74 items
ADH SKN CLS APL DERMABOND .7 (GAUZE/BANDAGES/DRESSINGS) ×1
APL SKNCLS STERI-STRIP NONHPOA (GAUZE/BANDAGES/DRESSINGS)
BAG DECANTER FOR FLEXI CONT (MISCELLANEOUS) ×3 IMPLANT
BENZOIN TINCTURE PRP APPL 2/3 (GAUZE/BANDAGES/DRESSINGS) IMPLANT
BLADE CLIPPER SURG (BLADE) IMPLANT
BLADE SURG 11 STRL SS (BLADE) ×3 IMPLANT
BUR MATCHSTICK NEURO 3.0 LAGG (BURR) ×3 IMPLANT
BUR ROUND FLUTED 5 RND (BURR) ×2 IMPLANT
BUR ROUND FLUTED 5MM RND (BURR) ×1
CANISTER SUCT 3000ML PPV (MISCELLANEOUS) ×6 IMPLANT
CARTRIDGE OIL MAESTRO DRILL (MISCELLANEOUS) ×1 IMPLANT
CHLORAPREP W/TINT 26ML (MISCELLANEOUS) ×3 IMPLANT
CLOSURE WOUND 1/2 X4 (GAUZE/BANDAGES/DRESSINGS)
CONT SPEC 4OZ CLIKSEAL STRL BL (MISCELLANEOUS) ×3 IMPLANT
DECANTER SPIKE VIAL GLASS SM (MISCELLANEOUS) ×3 IMPLANT
DERMABOND ADVANCED (GAUZE/BANDAGES/DRESSINGS) ×2
DERMABOND ADVANCED .7 DNX12 (GAUZE/BANDAGES/DRESSINGS) ×1 IMPLANT
DIFFUSER DRILL AIR PNEUMATIC (MISCELLANEOUS) ×3 IMPLANT
DRAPE MICROSCOPE LEICA (MISCELLANEOUS) ×3 IMPLANT
DRAPE POUCH INSTRU U-SHP 10X18 (DRAPES) ×3 IMPLANT
DRAPE SURG 17X23 STRL (DRAPES) ×3 IMPLANT
DRSG OPSITE POSTOP 4X6 (GAUZE/BANDAGES/DRESSINGS) ×2 IMPLANT
ELECT BLADE 4.0 EZ CLEAN MEGAD (MISCELLANEOUS) ×3
ELECT COATED BLADE 2.86 ST (ELECTRODE) ×3 IMPLANT
ELECT REM PT RETURN 9FT ADLT (ELECTROSURGICAL) ×3
ELECTRODE BLDE 4.0 EZ CLN MEGD (MISCELLANEOUS) IMPLANT
ELECTRODE REM PT RTRN 9FT ADLT (ELECTROSURGICAL) ×1 IMPLANT
GAUZE SPONGE 4X4 12PLY STRL (GAUZE/BANDAGES/DRESSINGS) IMPLANT
GAUZE SPONGE 4X4 16PLY XRAY LF (GAUZE/BANDAGES/DRESSINGS) IMPLANT
GLOVE BIO SURGEON STRL SZ7.5 (GLOVE) IMPLANT
GLOVE BIOGEL PI IND STRL 7.5 (GLOVE) ×2 IMPLANT
GLOVE BIOGEL PI INDICATOR 7.5 (GLOVE) ×4
GLOVE SS BIOGEL STRL SZ 7.5 (GLOVE) ×2 IMPLANT
GLOVE SUPERSENSE BIOGEL SZ 7.5 (GLOVE) ×4
GOWN STRL REUS W/ TWL LRG LVL3 (GOWN DISPOSABLE) ×1 IMPLANT
GOWN STRL REUS W/ TWL XL LVL3 (GOWN DISPOSABLE) IMPLANT
GOWN STRL REUS W/TWL LRG LVL3 (GOWN DISPOSABLE) ×3
GOWN STRL REUS W/TWL XL LVL3 (GOWN DISPOSABLE)
GRAFT DURAGEN MATRIX 1WX1L (Tissue) ×2 IMPLANT
HEMOSTAT POWDER KIT SURGIFOAM (HEMOSTASIS) ×3 IMPLANT
KIT BASIN OR (CUSTOM PROCEDURE TRAY) ×3 IMPLANT
KIT ROOM TURNOVER OR (KITS) ×3 IMPLANT
NDL HYPO 18GX1.5 BLUNT FILL (NEEDLE) ×1 IMPLANT
NDL HYPO 21X1.5 SAFETY (NEEDLE) ×2 IMPLANT
NDL SPNL 18GX3.5 QUINCKE PK (NEEDLE) IMPLANT
NEEDLE HYPO 18GX1.5 BLUNT FILL (NEEDLE) ×3 IMPLANT
NEEDLE HYPO 21X1.5 SAFETY (NEEDLE) ×9 IMPLANT
NEEDLE SPNL 18GX3.5 QUINCKE PK (NEEDLE) ×3 IMPLANT
NS IRRIG 1000ML POUR BTL (IV SOLUTION) ×3 IMPLANT
OIL CARTRIDGE MAESTRO DRILL (MISCELLANEOUS) ×3
PACK LAMINECTOMY NEURO (CUSTOM PROCEDURE TRAY) ×3 IMPLANT
PACK UNIVERSAL I (CUSTOM PROCEDURE TRAY) ×3 IMPLANT
PAD ARMBOARD 7.5X6 YLW CONV (MISCELLANEOUS) ×9 IMPLANT
PATTIES SURGICAL .5X1.5 (GAUZE/BANDAGES/DRESSINGS) ×3 IMPLANT
RUBBERBAND STERILE (MISCELLANEOUS) ×6 IMPLANT
SPONGE NEURO XRAY DETECT 1X3 (DISPOSABLE) ×3 IMPLANT
SPONGE SURGIFOAM ABS GEL SZ50 (HEMOSTASIS) ×3 IMPLANT
STRIP CLOSURE SKIN 1/2X4 (GAUZE/BANDAGES/DRESSINGS) IMPLANT
SUT ETHILON 2 0 FS 18 (SUTURE) ×2 IMPLANT
SUT MON AB 2-0 CT1 36 (SUTURE) ×2 IMPLANT
SUT STRATAFIX MNCRL+ 3-0 PS-2 (SUTURE)
SUT STRATAFIX MONOCRYL 3-0 (SUTURE)
SUT VIC AB 0 CT1 18XCR BRD8 (SUTURE) ×1 IMPLANT
SUT VIC AB 0 CT1 8-18 (SUTURE) ×6
SUT VIC AB 2-0 CT1 18 (SUTURE) ×5 IMPLANT
SUT VIC AB 4-0 PS2 27 (SUTURE) IMPLANT
SUTURE STRATFX MNCRL+ 3-0 PS-2 (SUTURE) IMPLANT
SYR 30ML LL (SYRINGE) ×6 IMPLANT
SYR 5ML LL (SYRINGE) ×3 IMPLANT
TOWEL GREEN STERILE (TOWEL DISPOSABLE) ×3 IMPLANT
TOWEL GREEN STERILE FF (TOWEL DISPOSABLE) ×3 IMPLANT
TUBE CONNECTING 12'X1/4 (SUCTIONS) ×1
TUBE CONNECTING 12X1/4 (SUCTIONS) ×2 IMPLANT
WATER STERILE IRR 1000ML POUR (IV SOLUTION) ×3 IMPLANT

## 2017-10-02 NOTE — Anesthesia Preprocedure Evaluation (Addendum)
Anesthesia Evaluation  Patient identified by MRN, date of birth, ID band Patient awake    Reviewed: Allergy & Precautions, H&P , NPO status , Patient's Chart, lab work & pertinent test results  Airway Mallampati: III  TM Distance: >3 FB Neck ROM: Full    Dental no notable dental hx. (+) Teeth Intact, Dental Advisory Given   Pulmonary Current Smoker,    Pulmonary exam normal breath sounds clear to auscultation       Cardiovascular hypertension, Pt. on medications and Pt. on home beta blockers  Rhythm:Regular Rate:Normal     Neuro/Psych  Headaches, Anxiety negative psych ROS   GI/Hepatic Neg liver ROS, GERD  Medicated and Controlled,  Endo/Other  diabetes, Type 2, Oral Hypoglycemic AgentsMorbid obesity  Renal/GU negative Renal ROS  negative genitourinary   Musculoskeletal  (+) Fibromyalgia -, narcotic dependent  Abdominal   Peds  Hematology negative hematology ROS (+)   Anesthesia Other Findings   Reproductive/Obstetrics negative OB ROS                            Anesthesia Physical Anesthesia Plan  ASA: III  Anesthesia Plan: General   Post-op Pain Management:    Induction: Intravenous  PONV Risk Score and Plan: 3 and Ondansetron, Dexamethasone and Midazolam  Airway Management Planned: Oral ETT  Additional Equipment:   Intra-op Plan:   Post-operative Plan: Extubation in OR  Informed Consent: I have reviewed the patients History and Physical, chart, labs and discussed the procedure including the risks, benefits and alternatives for the proposed anesthesia with the patient or authorized representative who has indicated his/her understanding and acceptance.   Dental advisory given  Plan Discussed with: CRNA  Anesthesia Plan Comments:        Anesthesia Quick Evaluation

## 2017-10-02 NOTE — H&P (Signed)
CC:  No chief complaint on file. Back and left leg pain  HPI: Caitlin Mcguire is a 36 y.o. female with low back, but mostly left leg pain.  She has a large disc herniation.  Presents for elective L4-5 microdiscectomy.  PMH: Past Medical History:  Diagnosis Date  . Anxiety   . Diabetes mellitus without complication (HCC)   . Fibromyalgia   . GERD (gastroesophageal reflux disease)   . Headache   . History of bronchitis   . History of kidney stones    age 36  . Hyperlipidemia   . Hypertension     PSH: Past Surgical History:  Procedure Laterality Date  . WISDOM TOOTH EXTRACTION      SH: Social History   Tobacco Use  . Smoking status: Current Every Day Smoker    Packs/day: 0.50    Types: Cigarettes  . Smokeless tobacco: Never Used  Substance Use Topics  . Alcohol use: No  . Drug use: No    MEDS: Prior to Admission medications   Medication Sig Start Date End Date Taking? Authorizing Provider  ALPRAZolam Prudy Feeler(XANAX) 1 MG tablet Take 1 mg by mouth 3 (three) times daily as needed for anxiety.   Yes [provider]  amphetamine-dextroamphetamine (ADDERALL) 30 MG tablet Take 30 mg by mouth 3 (three) times daily. Takes at [employer]6am, 1pm, and 6pm.   Yes [provider]  butalbital-acetaminophen-caffeine (FIORICET, ESGIC) 50-325-40 MG tablet Take 1 tablet by mouth every 6 (six) hours as needed for headache.    Yes [provider]  gabapentin (NEURONTIN) 300 MG capsule Take 600 mg by mouth 3 (three) times daily.    Yes [provider]  glipiZIDE (GLUCOTROL XL) 10 MG 24 hr tablet Take 10 mg by mouth daily with breakfast.   Yes [provider]  ibuprofen (ADVIL,MOTRIN) 800 MG tablet Take 800 mg by mouth every 8 (eight) hours as needed for mild pain or moderate pain.   Yes [provider]  lisinopril-hydrochlorothiazide (PRINZIDE,ZESTORETIC) 10-12.5 MG tablet Take 1 tablet by mouth daily.   Yes [provider]  metoprolol succinate  (TOPROL-XL) 25 MG 24 hr tablet Take 25 mg by mouth at bedtime.    Yes [provider]  oxyCODONE (ROXICODONE) 15 MG immediate release tablet Take 15 mg by mouth at bedtime.    Yes [provider]  oxyCODONE-acetaminophen (PERCOCET) 10-325 MG tablet Take 1 tablet by mouth 4 (four) times daily.   Yes [provider]  pravastatin (PRAVACHOL) 20 MG tablet Take 60 mg by mouth at bedtime.    Yes [provider]  tiZANidine (ZANAFLEX) 4 MG tablet Take 4 mg by mouth every 8 (eight) hours as needed for muscle spasms.    Yes [provider]  albuterol (PROVENTIL HFA;VENTOLIN HFA) 108 (90 Base) MCG/ACT inhaler Inhale 1 puff into the lungs every 4 (four) hours as needed for wheezing or shortness of breath.    [provider]    ALLERGY: Allergies  Allergen Reactions  . Penicillins     UNSPECIFIED REACTION AS CHILD  Has patient had a PCN reaction causing immediate rash, facial/tongue/throat swelling, SOB or lightheadedness with hypotension: Unknown Has patient had a PCN reaction causing severe rash involving mucus membranes or skin necrosis: Unknown Has patient had a PCN reaction that required hospitalization: Unknown Has patient had a PCN reaction occurring within the last 10 years: No If all of the above answers are "NO", then may proceed with Cephalosporin use.  ROS: ROS  NEUROLOGIC EXAM: Awake, alert, oriented Memory and concentration grossly intact Speech fluent, appropriate CN grossly intact Motor exam: Upper Extremities Deltoid Bicep Tricep Grip  Right 5/5 5/5 5/5 5/5  Left 5/5 5/5 5/5 5/5   Lower Extremity IP Quad PF DF EHL  Right 5/5 5/5 5/5 5/5 5/5  Left 5/5 5/5 5/5 4/5 4/5   Sensation grossly intact to LT  IMAGING: No new imaging  IMPRESSION: - 36 y.o. female with lumbar radiculopathy due to a large left L4-5 herniated nucleus pulposis.  She has a left foot drop.  PLAN: - Left L4-5 microdiscectomy - We have  reviewed the risks, benefits, and alternatives to surgery.  She wishes to proceed.

## 2017-10-02 NOTE — Brief Op Note (Signed)
10/02/2017  4:15 PM  PATIENT:  Caitlin Mcguire  36 y.o. female  PRE-OPERATIVE DIAGNOSIS:  HNP  POST-OPERATIVE DIAGNOSIS:  HNP  PROCEDURE:  Procedure(s): LUMBAR FOUR-FIVE MICRODISCECTOMY (Left)  SURGEON:  Surgeon(s) and Role:    * Cristan Scherzer, Loura HaltBenjamin Jared, MD - Primary    * Tressie StalkerJenkins, Jeffrey, MD - Assisting  PHYSICIAN ASSISTANT:   ASSISTANTS: Tressie StalkerJeffrey Jenkins, MD   ANESTHESIA:   general  EBL:  50 mL   BLOOD ADMINISTERED:none  DRAINS: none   LOCAL MEDICATIONS USED:  MARCAINE    and LIDOCAINE   SPECIMEN:  No Specimen  DISPOSITION OF SPECIMEN:  N/A  COUNTS:  YES  TOURNIQUET:  * No tourniquets in log *  DICTATION: .Dragon Dictation  PLAN OF CARE: Admit to inpatient   PATIENT DISPOSITION:  PACU - hemodynamically stable.   Delay start of Pharmacological VTE agent (>24hrs) due to surgical blood loss or risk of bleeding: yes

## 2017-10-02 NOTE — Anesthesia Procedure Notes (Signed)
Procedure Name: Intubation Date/Time: 10/02/2017 2:29 PM Performed by: Julieta Bellini, CRNA Pre-anesthesia Checklist: Patient identified, Emergency Drugs available, Suction available, Patient being monitored and Timeout performed Patient Re-evaluated:Patient Re-evaluated prior to induction Oxygen Delivery Method: Circle system utilized Preoxygenation: Pre-oxygenation with 100% oxygen Induction Type: IV induction Ventilation: Mask ventilation without difficulty Laryngoscope Size: Mac and 3 Grade View: Grade II Tube type: Oral Tube size: 7.0 mm Number of attempts: 1 Airway Equipment and Method: Stylet Placement Confirmation: ETT inserted through vocal cords under direct vision,  positive ETCO2 and breath sounds checked- equal and bilateral Secured at: 22 cm Tube secured with: Tape Dental Injury: Teeth and Oropharynx as per pre-operative assessment

## 2017-10-02 NOTE — Progress Notes (Signed)
Pharmacy Antibiotic Note  Caitlin Mcguire is a 36 y.o. female admitted on 10/02/2017 with surgical prophylaxis.  Pharmacy has been consulted for vancomycin dosing.  Patient underwent lumbar four-five microdiscectomy today. Last dose of vancomycin was on 11/5 at 1421. No drains post-procedure.  Plan: Vancomycin 1250 mg once 12 hours post-op.  Height: 5\' 3"  (160 cm) Weight: 242 lb 3 oz (109.9 kg) IBW/kg (Calculated) : 52.4  Temp (24hrs), Avg:98 F (36.7 C), Min:97.8 F (36.6 C), Max:98.1 F (36.7 C)  Recent Labs  Lab 09/28/17 1222  WBC 6.1  CREATININE 0.75    Estimated Creatinine Clearance: 114.6 mL/min (by C-G formula based on SCr of 0.75 mg/dL).    Allergies  Allergen Reactions  . Penicillins     UNSPECIFIED REACTION AS CHILD  Has patient had a PCN reaction causing immediate rash, facial/tongue/throat swelling, SOB or lightheadedness with hypotension: Unknown Has patient had a PCN reaction causing severe rash involving mucus membranes or skin necrosis: Unknown Has patient had a PCN reaction that required hospitalization: Unknown Has patient had a PCN reaction occurring within the last 10 years: No If all of the above answers are "NO", then may proceed with Cephalosporin use.     Thank you for allowing pharmacy to be a part of this patient's care.  Clarnce FlockKimberly A Perkins 10/02/2017 7:02 PM

## 2017-10-02 NOTE — Anesthesia Postprocedure Evaluation (Signed)
Anesthesia Post Note  Patient: Caitlin Mcguire  Procedure(s) Performed: LUMBAR FOUR-FIVE MICRODISCECTOMY (Left )     Patient location during evaluation: PACU Anesthesia Type: General Level of consciousness: awake and alert Pain management: pain level controlled Vital Signs Assessment: post-procedure vital signs reviewed and stable Respiratory status: spontaneous breathing, nonlabored ventilation and respiratory function stable Cardiovascular status: blood pressure returned to baseline and stable Postop Assessment: no apparent nausea or vomiting Anesthetic complications: no    Last Vitals:  Vitals:   10/02/17 1756 10/02/17 1800  BP: 137/84   Pulse: 69 73  Resp: 16 19  Temp:  36.6 C  SpO2: 96% 96%    Last Pain:  Vitals:   10/02/17 1715  TempSrc:   PainSc: 10-Worst pain ever      LLE Sensation: Numbness(preop) (10/02/17 1800)   RLE Sensation: Numbness(preop) (10/02/17 1800)      Lesieli Bresee,W. [place]

## 2017-10-02 NOTE — Transfer of Care (Signed)
Immediate Anesthesia Transfer of Care Note  Patient: Caitlin Mcguire  Procedure(s) Performed: LUMBAR FOUR-FIVE MICRODISCECTOMY (Left )  Patient Location: PACU  Anesthesia Type:General  Level of Consciousness: awake, alert , oriented and patient cooperative  Airway & Oxygen Therapy: Patient Spontanous Breathing and Patient connected to nasal cannula oxygen  Post-op Assessment: Report given to RN, Post -op Vital signs reviewed and stable and Patient moving all extremities X 4  Post vital signs: Reviewed and stable  Last Vitals:  Vitals:   10/02/17 1117 10/02/17 1627  BP: (!) 153/98   Pulse: 86   Resp: 18   Temp: 36.7 C (P) 36.7 C  SpO2: 99%     Last Pain:  Vitals:   10/02/17 1627  TempSrc:   PainSc: (P) 10-Worst pain ever      Patients Stated Pain Goal: 3 (10/02/17 1152)  Complications: No apparent anesthesia complications

## 2017-10-03 ENCOUNTER — Other Ambulatory Visit: Payer: Self-pay

## 2017-10-03 ENCOUNTER — Encounter (HOSPITAL_COMMUNITY): Payer: Self-pay | Admitting: *Deleted

## 2017-10-03 DIAGNOSIS — M5116 Intervertebral disc disorders with radiculopathy, lumbar region: Secondary | ICD-10-CM | POA: Diagnosis present

## 2017-10-03 DIAGNOSIS — Y753 Surgical instruments, materials and neurological devices (including sutures) associated with adverse incidents: Secondary | ICD-10-CM | POA: Diagnosis not present

## 2017-10-03 DIAGNOSIS — Z7984 Long term (current) use of oral hypoglycemic drugs: Secondary | ICD-10-CM | POA: Diagnosis not present

## 2017-10-03 DIAGNOSIS — E119 Type 2 diabetes mellitus without complications: Secondary | ICD-10-CM | POA: Diagnosis present

## 2017-10-03 DIAGNOSIS — F17210 Nicotine dependence, cigarettes, uncomplicated: Secondary | ICD-10-CM | POA: Diagnosis present

## 2017-10-03 DIAGNOSIS — G9781 Other intraoperative complications of nervous system: Secondary | ICD-10-CM | POA: Diagnosis not present

## 2017-10-03 DIAGNOSIS — M21372 Foot drop, left foot: Secondary | ICD-10-CM | POA: Diagnosis present

## 2017-10-03 DIAGNOSIS — K219 Gastro-esophageal reflux disease without esophagitis: Secondary | ICD-10-CM | POA: Diagnosis present

## 2017-10-03 DIAGNOSIS — G960 Cerebrospinal fluid leak: Secondary | ICD-10-CM | POA: Diagnosis not present

## 2017-10-03 DIAGNOSIS — Z79891 Long term (current) use of opiate analgesic: Secondary | ICD-10-CM | POA: Diagnosis not present

## 2017-10-03 DIAGNOSIS — Z791 Long term (current) use of non-steroidal anti-inflammatories (NSAID): Secondary | ICD-10-CM | POA: Diagnosis not present

## 2017-10-03 DIAGNOSIS — I10 Essential (primary) hypertension: Secondary | ICD-10-CM | POA: Diagnosis present

## 2017-10-03 DIAGNOSIS — Z79899 Other long term (current) drug therapy: Secondary | ICD-10-CM | POA: Diagnosis not present

## 2017-10-03 DIAGNOSIS — F419 Anxiety disorder, unspecified: Secondary | ICD-10-CM | POA: Diagnosis present

## 2017-10-03 DIAGNOSIS — E785 Hyperlipidemia, unspecified: Secondary | ICD-10-CM | POA: Diagnosis present

## 2017-10-03 DIAGNOSIS — M797 Fibromyalgia: Secondary | ICD-10-CM | POA: Diagnosis present

## 2017-10-03 DIAGNOSIS — Z6841 Body Mass Index (BMI) 40.0 and over, adult: Secondary | ICD-10-CM | POA: Diagnosis not present

## 2017-10-03 DIAGNOSIS — Y92234 Operating room of hospital as the place of occurrence of the external cause: Secondary | ICD-10-CM | POA: Diagnosis not present

## 2017-10-03 DIAGNOSIS — Y838 Other surgical procedures as the cause of abnormal reaction of the patient, or of later complication, without mention of misadventure at the time of the procedure: Secondary | ICD-10-CM | POA: Diagnosis not present

## 2017-10-03 DIAGNOSIS — Z880 Allergy status to penicillin: Secondary | ICD-10-CM | POA: Diagnosis not present

## 2017-10-03 DIAGNOSIS — Z87442 Personal history of urinary calculi: Secondary | ICD-10-CM | POA: Diagnosis not present

## 2017-10-03 LAB — GLUCOSE, CAPILLARY
GLUCOSE-CAPILLARY: 182 mg/dL — AB (ref 65–99)
GLUCOSE-CAPILLARY: 166 mg/dL — AB (ref 65–99)
Glucose-Capillary: 206 mg/dL — ABNORMAL HIGH (ref 65–99)
Glucose-Capillary: 267 mg/dL — ABNORMAL HIGH (ref 65–99)
Glucose-Capillary: 264 mg/dL — ABNORMAL HIGH (ref 65–99)

## 2017-10-03 MED ORDER — PANTOPRAZOLE SODIUM 40 MG PO TBEC
40.0000 mg | DELAYED_RELEASE_TABLET | Freq: Every day | ORAL | Status: DC
  Administered 2017-10-03: 40 mg via ORAL
  Filled 2017-10-03: qty 1

## 2017-10-03 MED ORDER — INSULIN ASPART 100 UNIT/ML ~~LOC~~ SOLN
0.0000 [IU] | Freq: Every day | SUBCUTANEOUS | Status: DC
  Administered 2017-10-03: 3 [IU] via SUBCUTANEOUS

## 2017-10-03 MED ORDER — MORPHINE SULFATE (PF) 4 MG/ML IV SOLN
2.0000 mg | INTRAVENOUS | Status: DC | PRN
  Administered 2017-10-03: 4 mg via INTRAVENOUS
  Filled 2017-10-03 (×2): qty 1

## 2017-10-03 MED ORDER — INSULIN ASPART 100 UNIT/ML ~~LOC~~ SOLN
0.0000 [IU] | Freq: Three times a day (TID) | SUBCUTANEOUS | Status: DC
  Administered 2017-10-03: 8 [IU] via SUBCUTANEOUS

## 2017-10-03 NOTE — Progress Notes (Signed)
Back sore Leg pain improving Moving legs well, better than preop Incision c/d/i and flat Continue head of bed flat for one more day

## 2017-10-04 LAB — GLUCOSE, CAPILLARY: Glucose-Capillary: 92 mg/dL (ref 65–99)

## 2017-10-04 MED ORDER — ALPRAZOLAM 1 MG PO TABS: 1.0000 mg | ORAL_TABLET | Freq: Three times a day (TID) | ORAL | 0 refills | Status: AC | PRN

## 2017-10-04 MED ORDER — OXYCODONE-ACETAMINOPHEN 10-325 MG PO TABS: 1.0000 | ORAL_TABLET | ORAL | 0 refills | Status: AC | PRN

## 2017-10-04 MED ORDER — GABAPENTIN 400 MG PO CAPS: 800.0000 mg | ORAL_CAPSULE | Freq: Every day | ORAL | 2 refills | Status: AC

## 2017-10-04 MED ORDER — AMPHETAMINE-DEXTROAMPHETAMINE 30 MG PO TABS: 30.0000 mg | ORAL_TABLET | Freq: Three times a day (TID) | ORAL | 0 refills | Status: AC

## 2017-10-04 NOTE — Evaluation (Signed)
Occupational Therapy Evaluation and Discharge Patient Details Name: Caitlin Mcguire MRN: [id number] DOB: [birth year] Today's Date: 10/04/2017    History of Present Illness Pt is a 36 y.o. female s/p L4-5 Microdiscectomy. PMHx: Anxiety, DM, Fibromyalgia, GERD, Hyperlipidemia, HTN.   Clinical Impression   Pt reports she was independent with ADL PTA. Currently pt overall supervision for ADL and functional mobility. All back, safety, and ADL education completed with pt. Pt planning to d/c home with supervision from family. No further acute OT needs identified; signing off at this time. Please re-consult if needs change. Thank you for this referral.    Follow Up Recommendations  No OT follow up;Supervision - Intermittent    Equipment Recommendations  3 in 1 bedside commode    Recommendations for Other Services       Precautions / Restrictions Precautions Precautions: Fall;Back Precaution Booklet Issued: No Precaution Comments: Pt able to recall 3/3 back precautions Restrictions Weight Bearing Restrictions: No      Mobility Bed Mobility               General bed mobility comments: Pt OOB in chair upon arrival  Transfers Overall transfer level: Needs assistance Equipment used: None Transfers: Sit to/from Stand Sit to Stand: Supervision         General transfer comment: for safety, no physical assist required    Balance Overall balance assessment: Needs assistance Sitting-balance support: Feet supported;No upper extremity supported Sitting balance-Leahy Scale: Good     Standing balance support: No upper extremity supported;During functional activity Standing balance-Leahy Scale: Fair                             ADL either performed or assessed with clinical judgement   ADL Overall ADL's : Needs assistance/impaired Eating/Feeding: Set up;Sitting   Grooming: Supervision/safety;Standing Grooming Details (indicate cue type and reason): Educated on use  of 2 cups for oral care Upper Body Bathing: Set up;Sitting   Lower Body Bathing: Supervison/ safety;Sit to/from stand   Upper Body Dressing : Set up;Sitting   Lower Body Dressing: Supervision/safety;Sit to/from stand Lower Body Dressing Details (indicate cue type and reason): Pt able to cross foot over opposite knee. Educated on compensatory strategies for LB ADL Toilet Transfer: Supervision/safety;Ambulation;BSC Toilet Transfer Details (indicate cue type and reason): Simulated by sit to stand from chair with functional mobility in room   Toileting - Clothing Manipulation Details (indicate cue type and reason): Educated pt on proper technique for peri care without twisting and use of wet wipes Tub/ Shower Transfer: Supervision/safety;Tub transfer;Ambulation;3 in 1 Tub/Shower Transfer Details (indicate cue type and reason): Simulated tub transfer in room with supervision. Educated on use of 3 in 1 in tub as a seat Functional mobility during ADLs: Supervision/safety General ADL Comments: Educated pt on maintaining back precuations during functional activities, keeping frequently used items at counter top height, and frequent mobility thoruhgout the day upon return home.     Vision         Perception     Praxis      Pertinent Vitals/Pain Pain Assessment: Faces Faces Pain Scale: Hurts whole lot Pain Location: back Pain Descriptors / Indicators: Aching;Sore;Discomfort Pain Intervention(s): Monitored during session     Hand Dominance     Extremity/Trunk Assessment Upper Extremity Assessment Upper Extremity Assessment: Overall WFL for tasks assessed   Lower Extremity Assessment Lower Extremity Assessment: Defer to PT evaluation   Cervical / Trunk Assessment Cervical / Trunk  Assessment: Other exceptions Cervical / Trunk Exceptions: s/p spinal sx   Communication Communication Communication: No difficulties   Cognition Arousal/Alertness: Awake/alert Behavior During Therapy:  Anxious Overall Cognitive Status: Within Functional Limits for tasks assessed                                     General Comments       Exercises     Shoulder Instructions      Home Living Family/patient expects to be discharged to:: Private residence Living Arrangements: Spouse/significant other;Children Available Help at Discharge: Family Type of Home: House Home Access: Stairs to enter Secretary/administratorntrance Stairs-Number of Steps: 1   Home Layout: One level     Bathroom Shower/Tub: IT trainerTub/shower unit;Curtain   Bathroom Toilet: Standard     Home Equipment: None          Prior Functioning/Environment Level of Independence: Independent                 OT Problem List:        OT Treatment/Interventions:      OT Goals(Current goals can be found in the care plan section) Acute Rehab OT Goals Patient Stated Goal: decrease pain OT Goal Formulation: All assessment and education complete, DC therapy  OT Frequency:     Barriers to D/C:            Co-evaluation              AM-PAC PT "6 Clicks" Daily Activity     Outcome Measure Help from another person eating meals?: None Help from another person taking care of personal grooming?: A Little Help from another person toileting, which includes using toliet, bedpan, or urinal?: A Little Help from another person bathing (including washing, rinsing, drying)?: A Little Help from another person to put on and taking off regular upper body clothing?: None Help from another person to put on and taking off regular lower body clothing?: A Little 6 Click Score: 20   End of Session Nurse Communication: Mobility status  Activity Tolerance: Patient tolerated treatment well Patient left: in chair;with call bell/phone within reach  OT Visit Diagnosis: Unsteadiness on feet (R26.81);Other abnormalities of gait and mobility (R26.89);Pain Pain - part of body: (back)                Time: 1610-96041144-1202 OT Time  Calculation (min): 18 min Charges:  OT General Charges $OT Visit: 1 Visit OT Evaluation $OT Eval Moderate Complexity: 1 Mod G-Codes:     Caitlin Mcguire A. Caitlin Mcguire, M.S., OTR/L Pager: [phone]  Caitlin AlkenBailey A Shelbia Mcguire 10/04/2017, 12:12 PM

## 2017-10-04 NOTE — Care Management Note (Signed)
Case Management Note  Patient Details  Name: Caitlin Mcguire MRN: [id number] Date of Birth: [birth year]  Subjective/Objective:     Pt admitted on 10/02/17 s/p L4-5 microdiscectomy.  PTA, pt independent, lives with fiance and child.                 Action/Plan: PT/OT recommending no OP follow up, DME.  [facility]  RW and 3 in 1 to be delivered to pt's room prior to dc.    Expected Discharge Date:  10/04/17               Expected Discharge Plan:  Home/Self Care  In-House Referral:     Discharge planning Services  CM Consult  Post Acute Care Choice:    Choice offered to:     DME Arranged:  3-N-1, Walker rolling DME Agency:  [facility]  [facility]     Status of Service:  Completed, signed off  If discussed at [employer] Length of Stay Meetings, dates discussed:    Additional Comments:  Quintella BatonJulie W. Bellamy Judson, RN, BSN  Trauma/Neuro ICU Case Manager [phone]

## 2017-10-04 NOTE — Discharge Summary (Signed)
Physician Discharge Summary  Patient ID: Caitlin Mcguire MRN: [id number] DOB/AGE: Jan 05, 1981 36 y.o.  Admit date: 10/02/2017 Discharge date: 10/04/2017  Admission Diagnoses:  Lumbar HNP  Discharge Diagnoses:  Same Active Problems:   Lumbar disc herniation with radiculopathy  Discharged Condition: Stable  Hospital Course:  Caitlin Mcguire is a 36 y.o. female who was admitted for the below procedure. Intra op was complicated by CSF leak requiring pt to remain flat. No headaches or swelling at surgical site. There were no post operative complications. At time of discharge, pain was well controlled, ambulating with Pt/OT, tolerating po, voiding normal. Ready for discharge.  Treatments: Surgery LUMBAR FOUR-FIVE MICRODISCECTOMY (Left)    Discharge Exam: Blood pressure (!) 106/59, pulse (!) 57, temperature 98.6 F (37 C), temperature source Oral, resp. rate 18, height 5\' 3"  (1.6 m), weight 109.9 kg (242 lb 3 oz), last menstrual period 09/22/2017, SpO2 98 %, unknown if currently breastfeeding. Awake, alert, oriented Speech fluent, appropriate CN grossly intact 5/5 BUE/BLE with exception of improved left drop foot Wound c/d/i/flat  Disposition: 01-Home or Self Care  Discharge Instructions    Call MD for:  difficulty breathing, headache or visual disturbances   Complete by:  As directed    Call MD for:  persistant dizziness or light-headedness   Complete by:  As directed    Call MD for:  redness, tenderness, or signs of infection (pain, swelling, redness, odor or green/yellow discharge around incision site)   Complete by:  As directed    Call MD for:  severe uncontrolled pain   Complete by:  As directed    Call MD for:  temperature >100.4   Complete by:  As directed    Diet general   Complete by:  As directed    Driving Restrictions   Complete by:  As directed    Do not drive until given clearance.   Increase activity slowly   Complete by:  As directed    Lifting  restrictions   Complete by:  As directed    Do not lift anything >10lbs. Avoid bending and twisting in awkward positions. Avoid bending at the back.   May shower / Bathe   Complete by:  As directed    In 24 hours. Okay to wash wound with warm soapy water. Avoid scrubbing the wound. Pat dry.   Remove dressing in 24 hours   Complete by:  As directed       Follow-up Information    Bernadette Armijo, Loura HaltBenjamin Jared, MD. Schedule an appointment as soon as possible for a visit in 3 week(s).   Specialty:  Neurosurgery Contact information: [address] [place] [address] [phone]           Signed: Alyson InglesCOSTELLA, VINCENT J 10/04/2017, 8:54 AM   Allergies as of 10/04/2017      Reactions   Penicillins    UNSPECIFIED REACTION AS CHILD Has patient had a PCN reaction causing immediate rash, facial/tongue/throat swelling, SOB or lightheadedness with hypotension: Unknown Has patient had a PCN reaction causing severe rash involving mucus membranes or skin necrosis: Unknown Has patient had a PCN reaction that required hospitalization: Unknown Has patient had a PCN reaction occurring within the last 10 years: No If all of the above answers are "NO", then may proceed with Cephalosporin use.      Medication List    STOP taking these medications   oxyCODONE 15 MG immediate release tablet Commonly known as:  ROXICODONE  TAKE these medications   albuterol 108 (90 Base) MCG/ACT inhaler Commonly known as:  PROVENTIL HFA;VENTOLIN HFA Inhale 1 puff into the lungs every 4 (four) hours as needed for wheezing or shortness of breath.   ALPRAZolam 1 MG tablet Commonly known as:  XANAX Take 1 mg by mouth 3 (three) times daily as needed for anxiety. What changed:  Another medication with the same name was added. Make sure you understand how and when to take each.   ALPRAZolam 1 MG tablet Commonly known as:  XANAX Take 1 tablet (1 mg total) 3 (three) times daily as needed by mouth for  anxiety. What changed:  You were already taking a medication with the same name, and this prescription was added. Make sure you understand how and when to take each.   amphetamine-dextroamphetamine 30 MG tablet Commonly known as:  ADDERALL Take 30 mg by mouth 3 (three) times daily. Takes at 6am, 1pm, and 6pm. What changed:  Another medication with the same name was added. Make sure you understand how and when to take each.   amphetamine-dextroamphetamine 30 MG tablet Commonly known as:  ADDERALL Take 1 tablet 3 (three) times daily by mouth. What changed:  You were already taking a medication with the same name, and this prescription was added. Make sure you understand how and when to take each.   butalbital-acetaminophen-caffeine 50-325-40 MG tablet Commonly known as:  FIORICET, ESGIC Take 1 tablet by mouth every 6 (six) hours as needed for headache.   gabapentin 400 MG capsule Commonly known as:  NEURONTIN Take 2 capsules (800 mg total) at bedtime by mouth. What changed:    medication strength  how much to take  when to take this   glipiZIDE 10 MG 24 hr tablet Commonly known as:  GLUCOTROL XL Take 10 mg by mouth daily with breakfast.   ibuprofen 800 MG tablet Commonly known as:  ADVIL,MOTRIN Take 800 mg by mouth every 8 (eight) hours as needed for mild pain or moderate pain.   lisinopril-hydrochlorothiazide 10-12.5 MG tablet Commonly known as:  PRINZIDE,ZESTORETIC Take 1 tablet by mouth daily.   metoprolol succinate 25 MG 24 hr tablet Commonly known as:  TOPROL-XL Take 25 mg by mouth at bedtime.   oxyCODONE-acetaminophen 10-325 MG tablet Commonly known as:  PERCOCET Take 1 tablet every 4 (four) hours as needed by mouth for pain. What changed:    when to take this  reasons to take this   pravastatin 20 MG tablet Commonly known as:  PRAVACHOL Take 60 mg by mouth at bedtime.   tiZANidine 4 MG tablet Commonly known as:  ZANAFLEX Take 4 mg by mouth every 8  (eight) hours as needed for muscle spasms.            Durable Medical Equipment  (From admission, onward)        Start     Ordered   10/04/17 1158  For home use only DME Walker rolling  Once    Question:  Patient needs a walker to treat with the following condition  Answer:  Lumbar herniated disc   10/04/17 1157   10/04/17 1157  For home use only DME Bedside commode  Once    Question:  Patient needs a bedside commode to treat with the following condition  Answer:  Lumbar herniated disc   10/04/17 1157

## 2017-10-04 NOTE — Plan of Care (Signed)
PAIN CONTROL REMAINS A CHALLENGE FOR PATIENT, BUT NOT AN OBSTACLE TO SAFE MOBILITY. OOB CHAIR WITH BREAKFAST THIS AM.

## 2017-10-04 NOTE — Evaluation (Signed)
Physical Therapy Evaluation Patient Details Name: Caitlin Mcguire MRN: [id number] DOB: October 01, 1981 Today's Date: 10/04/2017   History of Present Illness  Pt is a 36 y.o. female s/p L4-5 Microdiscectomy. PMHx: Anxiety, DM, Fibromyalgia, GERD, Hyperlipidemia, HTN.  Clinical Impression  Patient seen for mobility assessment s/p spinal surgery. Patient demonstrates deficits in functional mobility as indicated below. Will benefit from continued skilled PT to address deficits and maximize function. Will see as indicated and progress as tolerated.     Follow Up Recommendations Supervision/Assistance - 24 hour    Equipment Recommendations  Rolling walker with 5" wheels    Recommendations for Other Services       Precautions / Restrictions Precautions Precautions: Fall;Back Precaution Booklet Issued: No Precaution Comments: Pt able to recall 3/3 back precautions Restrictions Weight Bearing Restrictions: No      Mobility  Bed Mobility               General bed mobility comments: received walking around in room  Transfers Overall transfer level: Needs assistance Equipment used: None Transfers: Sit to/from Stand Sit to Stand: Supervision         General transfer comment: for safety, no physical assist required  Ambulation/Gait Ambulation/Gait assistance: Min guard;Supervision Ambulation Distance (Feet): 160 Feet Assistive device: None Gait Pattern/deviations: Step-through pattern;Decreased stride length;Shuffle;Trunk flexed;Wide base of support Gait velocity: decreased Gait velocity interpretation: Below normal speed for age/gender General Gait Details: VCs for upright posture and increased cadence, patient with some modest instability noted  Stairs            Wheelchair Mobility    Modified Rankin (Stroke Patients Only)       Balance Overall balance assessment: Needs assistance Sitting-balance support: Feet supported;No upper extremity supported Sitting  balance-Leahy Scale: Good     Standing balance support: No upper extremity supported;During functional activity Standing balance-Leahy Scale: Fair                               Pertinent Vitals/Pain Pain Assessment: Faces Faces Pain Scale: Hurts whole lot Pain Location: back Pain Descriptors / Indicators: Aching;Sore;Discomfort Pain Intervention(s): Monitored during session    Home Living Family/patient expects to be discharged to:: Private residence Living Arrangements: Spouse/significant other;Children Available Help at Discharge: Family Type of Home: House Home Access: Stairs to enter   Secretary/administratorntrance Stairs-Number of Steps: 1 Home Layout: One level Home Equipment: None      Prior Function Level of Independence: Independent               Hand Dominance   Dominant Hand: Right    Extremity/Trunk Assessment   Upper Extremity Assessment Upper Extremity Assessment: Overall WFL for tasks assessed    Lower Extremity Assessment Lower Extremity Assessment: Generalized weakness    Cervical / Trunk Assessment Cervical / Trunk Assessment: Other exceptions Cervical / Trunk Exceptions: s/p spinal sx  Communication   Communication: No difficulties  Cognition Arousal/Alertness: Awake/alert Behavior During Therapy: Anxious Overall Cognitive Status: Within Functional Limits for tasks assessed                                        General Comments      Exercises     Assessment/Plan    PT Assessment Patient needs continued PT services  PT Problem List         PT  Treatment Interventions DME instruction;Gait training;Functional mobility training;Therapeutic activities;Therapeutic exercise;Balance training;Neuromuscular re-education;Patient/family education    PT Goals (Current goals can be found in the Care Plan section)  Acute Rehab PT Goals Patient Stated Goal: decrease pain PT Goal Formulation: With patient Time For Goal  Achievement: 10/18/17 Potential to Achieve Goals: Good    Frequency Min 5X/week   Barriers to discharge        Co-evaluation               AM-PAC PT "6 Clicks" Daily Activity  Outcome Measure Difficulty turning over in bed (including adjusting bedclothes, sheets and blankets)?: A Little Difficulty moving from lying on back to sitting on the side of the bed? : A Little Difficulty sitting down on and standing up from a chair with arms (e.g., wheelchair, bedside commode, etc,.)?: A Little Help needed moving to and from a bed to chair (including a wheelchair)?: A Little Help needed walking in hospital room?: A Lot Help needed climbing 3-5 steps with a railing? : A Little 6 Click Score: 17    End of Session Equipment Utilized During Treatment: Gait belt Activity Tolerance: Patient tolerated treatment well;Patient limited by pain Patient left: in bed;with call bell/phone within reach;with family/visitor present Nurse Communication: Mobility status PT Visit Diagnosis: Unsteadiness on feet (R26.81);Difficulty in walking, not elsewhere classified (R26.2)    Time: 9147-82951053-1114 PT Time Calculation (min) (ACUTE ONLY): 21 min   Charges:   PT Evaluation $PT Eval Moderate Complexity: 1 Mod     PT G Codes:   PT G-Codes **NOT FOR INPATIENT CLASS** Functional Assessment Tool Used: Clinical judgement Functional Limitation: Mobility: Walking and moving around Mobility: Walking and Moving Around Current Status (A2130(G8978): At least 20 percent but less than 40 percent impaired, limited or restricted Mobility: Walking and Moving Around Goal Status [phone](G8979): At least 1 percent but less than 20 percent impaired, limited or restricted    Charlotte Crumbevon Ifeoluwa Bartz, PT DPT  Board Certified Neurologic Specialist [phone]   Fabio AsaDevon J Cadan Maggart 10/04/2017, 1:21 PM

## 2017-10-09 NOTE — Op Note (Signed)
10/02/2017  12:44 PM  PATIENT:  Caitlin Mcguire  36 y.o. female  PRE-OPERATIVE DIAGNOSIS:  Left L4-5 disc herniation; lumbar radiculopathy; morbid obesity  POST-OPERATIVE DIAGNOSIS:  Same  PROCEDURE:  Left L4-5 microdiscectomy  SURGEON:  Hulan SaasBenjamin J. Ahriana Gunkel, MD  ASSISTANTS: Tressie StalkerJeffrey Jenkins, MD  ANESTHESIA:   General  DRAINS: None   SPECIMEN:  None  INDICATION FOR PROCEDURE: 36 year old woman with a large disc herniation.  I recommended the above procedure. Patient understood the risks, benefits, and alternatives and potential outcomes and wished to proceed.  PROCEDURE DETAILS:  .bd After smooth induction of general endotracheal anesthesia the patient was turned prone on the operating table on a Wilson frame.  The skin of the lumbar region was clipped of hair. It was wiped down with alcohol. The patient was then prepped and draped in usual sterile fashion.   A localizing film was taken with a spinal needle.  The skin was opened in the midline.  The soft tissues were dissected with monopolar cautery.  I dissected down the left side of the L4 and L5 spinous processes.  I confirmed the appropriate level with an intraoperative xray.  The operating microscope was brought into the field. Using microsurgical technique the remaining adherent soft tissue was removed from the lamina. The high-speed drill was used to thin the inferior lamina of L4 and superior lamina of L5.  The hemilaminectomy was completed with Kerrison punches and pituitary rongeurs.  The ligamentum flavum was elevated with a nerve hook and then resected with Kerrison punches. Epidural fat was suctioned away to reveal the thecal sac. Residual ligament was resected.  The L5 nerve root was identified. This was medialized.  In doing this there was a durotomy over the L5 nerve root which I determined could not be directly repaired.  The disc space was identified.  The annulus was opened sharply. A free disc fragment was identified and  this was resected using pituitary rongeurs and Kerrisons. Further searching revealed a large disc fragment below the axilla of the left L5 nerve root.  This was removed with a pituitary rongeur.  After this was complete I palpated the ventral edge of the thecal sac and nerve root to determine if there was no residual compression. I was satisfied that this was the case.  I irrigated vigorously with bacitracin saline. There was excellent hemostasis. I placed a piece of duramatrix over the durotomy and then sealed it with Adherus. The wound was closed in routine anatomic layers using interrupted Vicryl sutures. The skin was closed with a running locked moncryl and sealed with Dermabond.  PATIENT DISPOSITION:  PACU - hemodynamically stable.   Delay start of Pharmacological VTE agent (>24hrs) due to surgical blood loss or risk of bleeding:  yes

## 2017-11-02 ENCOUNTER — Emergency Department (HOSPITAL_COMMUNITY)
Admission: EM | Admit: 2017-11-02 | Discharge: 2017-11-02 | Disposition: A | Discharge status: Home / Self Care | Payer: Medicaid Other | Attending: Emergency Medicine | Admitting: Emergency Medicine

## 2017-11-02 ENCOUNTER — Encounter (HOSPITAL_COMMUNITY): Payer: Self-pay | Admitting: Emergency Medicine

## 2017-11-02 DIAGNOSIS — E119 Type 2 diabetes mellitus without complications: Secondary | ICD-10-CM | POA: Diagnosis not present

## 2017-11-02 DIAGNOSIS — I10 Essential (primary) hypertension: Secondary | ICD-10-CM | POA: Diagnosis not present

## 2017-11-02 DIAGNOSIS — Z5189 Encounter for other specified aftercare: Secondary | ICD-10-CM | POA: Insufficient documentation

## 2017-11-02 DIAGNOSIS — Z7984 Long term (current) use of oral hypoglycemic drugs: Secondary | ICD-10-CM | POA: Diagnosis not present

## 2017-11-02 DIAGNOSIS — M5106 Intervertebral disc disorders with myelopathy, lumbar region: Secondary | ICD-10-CM | POA: Insufficient documentation

## 2017-11-02 DIAGNOSIS — Z885 Allergy status to narcotic agent status: Secondary | ICD-10-CM | POA: Insufficient documentation

## 2017-11-02 DIAGNOSIS — Y658 Other specified misadventures during surgical and medical care: Secondary | ICD-10-CM | POA: Diagnosis not present

## 2017-11-02 DIAGNOSIS — F17210 Nicotine dependence, cigarettes, uncomplicated: Secondary | ICD-10-CM | POA: Insufficient documentation

## 2017-11-02 DIAGNOSIS — Z79899 Other long term (current) drug therapy: Secondary | ICD-10-CM | POA: Insufficient documentation

## 2017-11-02 DIAGNOSIS — T8149XA Infection following a procedure, other surgical site, initial encounter: Secondary | ICD-10-CM | POA: Diagnosis not present

## 2017-11-02 LAB — CBC WITH DIFFERENTIAL/PLATELET
BASOS PCT: 0 %
Basophils Absolute: 0 10*3/uL (ref 0.0–0.1)
Eosinophils Absolute: 0.1 10*3/uL (ref 0.0–0.7)
Eosinophils Relative: 1 %
HEMATOCRIT: 43.2 % (ref 36.0–46.0)
HEMOGLOBIN: 14.5 g/dL (ref 12.0–15.0)
LYMPHS ABS: 2.8 10*3/uL (ref 0.7–4.0)
Lymphocytes Relative: 27 %
MCH: 32.7 pg (ref 26.0–34.0)
MCHC: 33.6 g/dL (ref 30.0–36.0)
MCV: 97.3 fL (ref 78.0–100.0)
MONOS PCT: 8 %
Monocytes Absolute: 0.8 10*3/uL (ref 0.1–1.0)
NEUTROS ABS: 6.4 10*3/uL (ref 1.7–7.7)
NEUTROS PCT: 64 %
Platelets: 196 10*3/uL (ref 150–400)
RBC: 4.44 MIL/uL (ref 3.87–5.11)
RDW: 12.4 % (ref 11.5–15.5)
WBC: 10.1 10*3/uL (ref 4.0–10.5)

## 2017-11-02 LAB — I-STAT CG4 LACTIC ACID, ED
Lactic Acid, Venous: 2.16 mmol/L (ref 0.5–1.9)
Lactic Acid, Venous: 1.46 mmol/L (ref 0.5–1.9)

## 2017-11-02 LAB — COMPREHENSIVE METABOLIC PANEL
ALK PHOS: 62 U/L (ref 38–126)
ALT: 39 U/L (ref 14–54)
ANION GAP: 10 (ref 5–15)
AST: 34 U/L (ref 15–41)
Albumin: 4 g/dL (ref 3.5–5.0)
BILIRUBIN TOTAL: 0.6 mg/dL (ref 0.3–1.2)
BUN: 14 mg/dL (ref 6–20)
CALCIUM: 9.2 mg/dL (ref 8.9–10.3)
CO2: 24 mmol/L (ref 22–32)
CREATININE: 0.82 mg/dL (ref 0.44–1.00)
Chloride: 99 mmol/L — ABNORMAL LOW (ref 101–111)
GFR calc Af Amer: >60 mL/min (ref >60)
GFR calc non Af Amer: >60 mL/min (ref >60)
GLUCOSE: 134 mg/dL — AB (ref 65–99)
Potassium: 3.9 mmol/L (ref 3.5–5.1)
Sodium: 133 mmol/L — ABNORMAL LOW (ref 135–145)
TOTAL PROTEIN: 7.4 g/dL (ref 6.5–8.1)

## 2017-11-02 LAB — CBG MONITORING, ED: Glucose-Capillary: 144 mg/dL — ABNORMAL HIGH (ref 65–99)

## 2017-11-02 MED ORDER — CEPHALEXIN 500 MG PO CAPS
500.0000 mg | ORAL_CAPSULE | Freq: Once | ORAL | Status: AC
  Administered 2017-11-02: 500 mg via ORAL
  Filled 2017-11-02: qty 1

## 2017-11-02 MED ORDER — SODIUM CHLORIDE 0.9 % IV BOLUS (SEPSIS)
1000.0000 mL | Freq: Once | INTRAVENOUS | Status: AC
  Administered 2017-11-02: 1000 mL via INTRAVENOUS

## 2017-11-02 MED ORDER — MORPHINE SULFATE (PF) 4 MG/ML IV SOLN
4.0000 mg | Freq: Once | INTRAVENOUS | Status: AC
  Administered 2017-11-02: 4 mg via INTRAVENOUS
  Filled 2017-11-02: qty 1

## 2017-11-02 MED ORDER — CEPHALEXIN 500 MG PO CAPS: 500.0000 mg | ORAL_CAPSULE | Freq: Three times a day (TID) | ORAL | 0 refills | Status: AC

## 2017-11-02 MED ORDER — ONDANSETRON HCL 4 MG/2ML IJ SOLN
4.0000 mg | Freq: Once | INTRAMUSCULAR | Status: AC
  Administered 2017-11-02: 4 mg via INTRAVENOUS
  Filled 2017-11-02: qty 2

## 2017-11-02 NOTE — ED Notes (Signed)
I-Stat Lactic Result= 1.46.

## 2017-11-02 NOTE — ED Triage Notes (Signed)
Pt reports having back surgery November 5th and her incision is painful, red, and draining yellow and green pus.

## 2017-11-02 NOTE — ED Notes (Signed)
I-Stat Lactic= 2.16.

## 2017-11-02 NOTE — ED Notes (Signed)
Checked with pt for urine sample for POC urine pregnancy,pt can't go right now will try back in 30 minutes.

## 2017-11-02 NOTE — ED Notes (Signed)
2120 paged Neurosurgery for second time to Dr Verdie MosherLiu @ [phone]

## 2017-11-02 NOTE — ED Provider Notes (Signed)
[facility] Provider Note   CSN: [id number] Arrival date & time: 11/02/17  1802     History   Chief Complaint Chief Complaint  Patient presents with  . Wound Check    HPI Caitlin Mcguire is a 36 y.o. female.  HPI 36 year old female who presents for wound check. History of DM, fibromyalgia, and s/p L4-5 microdiscectomy by Dr. Bevely Palmeritty 10/02/2017. Over past 5 days reports increased pain, surrounding redness, and yellow to green fluid draining from her wound. Saw PCP yesterday who recommended that she see her physician in [place] for evaluation. She notes low grade fever yesterday. No vomiting, diarrhea, abdominal pain, urinary retention, bowel incontinence, new numbness or weakness in the legs. Baseline left leg numbness below the knee and left leg weakness from previous herniated disc, not changed since surgery.    Past Medical History:  Diagnosis Date  . Anxiety   . Diabetes mellitus without complication (HCC)   . Fibromyalgia   . GERD (gastroesophageal reflux disease)   . Headache   . History of bronchitis   . History of kidney stones    age 36  . Hyperlipidemia   . Hypertension     Patient Active Problem List   Diagnosis Date Noted  . 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 BACK PAIN 01/17/2008    Past Surgical History:  Procedure Laterality Date  . LUMBAR LAMINECTOMY/DECOMPRESSION MICRODISCECTOMY Left 10/02/2017   Procedure: LUMBAR FOUR-FIVE MICRODISCECTOMY;  Surgeon: Ditty, Loura HaltBenjamin Jared, MD;  [facility];  Service: Neurosurgery;  Laterality: Left;  . WISDOM TOOTH EXTRACTION      OB History    Gravida Para Term Preterm AB Living   7       3 2    SAB TAB Ectopic Multiple Live Births   3               Home Medications    Prior to Admission medications   Medication Sig Start Date End Date Taking? Authorizing Provider  ALPRAZolam Prudy Feeler(XANAX) 1 MG tablet Take 1 tablet (1 mg total) 3 (three) times daily as needed by  mouth for anxiety. 10/04/17  Yes Ditty, Loura HaltBenjamin Jared, MD  amphetamine-dextroamphetamine (ADDERALL) 30 MG tablet Take 1 tablet 3 (three) times daily by mouth. 10/04/17  Yes Ditty, Loura HaltBenjamin Jared, MD  butalbital-acetaminophen-caffeine (FIORICET, ESGIC) [phone] MG tablet Take 1 tablet by mouth every 6 (six) hours as needed for headache.    Yes [provider]  gabapentin (NEURONTIN) 300 MG capsule Take 900 mg by mouth 3 (three) times daily.   Yes [provider]  glipiZIDE (GLUCOTROL XL) 10 MG 24 hr tablet Take 10 mg by mouth daily with breakfast.   Yes [provider]  ibuprofen (ADVIL,MOTRIN) 800 MG tablet Take 800 mg by mouth every 8 (eight) hours as needed for mild pain or moderate pain.   Yes [provider]  lisinopril-hydrochlorothiazide (PRINZIDE,ZESTORETIC) 10-12.5 MG tablet Take 1 tablet by mouth daily.   Yes [provider]  metoprolol succinate (TOPROL-XL) 25 MG 24 hr tablet Take 25 mg by mouth at bedtime.    Yes [provider]  oxyCODONE (ROXICODONE) 15 MG immediate release tablet TAKE 1-2  BY MOUTH NIGHTLY  AS NEEDED FOR BREAK THROUGH PAIN 10/16/17  Yes [provider]  oxyCODONE-acetaminophen (PERCOCET) 10-325 MG tablet Take 1 tablet every 4 (four) hours as needed by mouth for pain. 10/04/17  Yes Costella, Darci CurrentVincent J, PA-C  pravastatin (PRAVACHOL) 20 MG tablet Take  20 mg by mouth at bedtime.    Yes [provider]  tiZANidine (ZANAFLEX) 4 MG tablet Take 4 mg by mouth every 8 (eight) hours as needed for muscle spasms.    Yes [provider]  cephALEXin (KEFLEX) 500 MG capsule Take 1 capsule (500 mg total) by mouth 3 (three) times daily for 7 days. 11/02/17 11/09/17  Lavera GuiseLiu, Amrit Erck Duo, MD  gabapentin (NEURONTIN) 400 MG capsule Take 2 capsules (800 mg total) at bedtime by mouth. Patient not taking: Reported on 11/02/2017 10/04/17   Alyson Inglesostella, Vincent J, PA-C    Family History History reviewed. No pertinent family  history.  Social History Social History   Tobacco Use  . Smoking status: Current Every Day Smoker    Packs/day: 0.50    Types: Cigarettes  . Smokeless tobacco: Never Used  Substance Use Topics  . Alcohol use: No  . Drug use: No     Allergies   Penicillins   Review of Systems Review of Systems  Constitutional: Positive for fatigue.  Respiratory: Negative for shortness of breath.   Gastrointestinal: Negative for abdominal pain.  Musculoskeletal: Positive for back pain.  Skin: Positive for wound.  All other systems reviewed and are negative.    Physical Exam Updated Vital Signs BP (!) 107/59 (BP Location: Right Arm)   Pulse 93   Temp 98.6 F (37 C) (Oral)   Resp 20   LMP 10/21/2017   SpO2 97%   Physical Exam Physical Exam  Nursing note and vitals reviewed. Constitutional: Well developed, well nourished, non-toxic, and in no acute distress Head: Normocephalic and atraumatic.  Mouth/Throat: Oropharynx is clear and moist.  Neck: Normal range of motion. Neck supple.  Cardiovascular: Tachycardic rate and regular rhythm.   Pulmonary/Chest: Effort normal and breath sounds normal.  Abdominal: Soft. There is no tenderness. There is no rebound and no guarding.  Musculoskeletal: Normal range of motion.  Neurological: Alert, no facial droop, fluent speech, moves all extremities symmetrically Skin: Skin is warm and dry. See picture for wound.  Psychiatric: Cooperative     ED Treatments / Results  Labs (all labs ordered are listed, but only abnormal results are displayed) Labs Reviewed  COMPREHENSIVE METABOLIC PANEL - Abnormal; Notable for the following components:      Result Value   Sodium 133 (*)    Chloride 99 (*)    Glucose, Bld 134 (*)    All other components within normal limits  CBG MONITORING, ED - Abnormal; Notable for the following components:   Glucose-Capillary 144 (*)    All other components within normal limits  I-STAT CG4 LACTIC ACID, ED -  Abnormal; Notable for the following components:   Lactic Acid, Venous 2.16 (*)    All other components within normal limits  CBC WITH DIFFERENTIAL/PLATELET  I-STAT CG4 LACTIC ACID, ED  POC URINE PREG, ED    EKG  EKG Interpretation None       Radiology No results found.  Procedures Procedures (including critical care time)  Medications Ordered in ED Medications  sodium chloride 0.9 % bolus 1,000 mL (0 mLs Intravenous Stopped 11/02/17 2029)  morphine 4 MG/ML injection 4 mg (4 mg Intravenous Given 11/02/17 1857)  ondansetron (ZOFRAN) injection 4 mg (4 mg Intravenous Given 11/02/17 1857)  sodium chloride 0.9 % bolus 1,000 mL (1,000 mLs Intravenous New Bag/Given 11/02/17 2031)  cephALEXin (KEFLEX) capsule 500 mg (500 mg Oral Given 11/02/17 2141)     Initial Impression / Assessment and Plan / ED Course  I have reviewed the triage vital signs and the nursing notes.  Pertinent labs & imaging results that were available during my care of the patient were reviewed by me and considered in my medical decision making (see chart for details).     36 year old female who presents with concern for wound infection.  She is nontoxic in no acute distress.  Afebrile and hemodynamically stable.  Midline incision with some mild surrounding erythema and induration.  No obvious drainage.  She has no significant leukocytosis.  Discussed with neurosurgery, on call for Dr. Bevely Palmer. He does note that she missed her follow-up appt today in the office with them. Recommended Keflex and close outpatient follow-up in the neurosurgery clinic given no systemic signs or symptoms of illness.  She she will be prescribed a week of Keflex.  Neurosurgery will call her tomorrow to arrange 1 day follow-up. Strict return and follow-up instructions reviewed. She expressed understanding of all discharge instructions and felt comfortable with the plan of care.   Final Clinical Impressions(s) / ED Diagnoses   Final diagnoses:    Visit for wound check  Wound cellulitis after surgery    ED Discharge Orders        Ordered    cephALEXin (KEFLEX) 500 MG capsule  3 times daily     11/02/17 2138       Lavera Guise, MD 11/02/17 2142

## 2017-11-02 NOTE — Discharge Instructions (Signed)
Please take antibiotics for wound infection. Dr. Mervyn Gayitty's office will call you tomorrow to arrange close follow-up appointment Return for worsening symptoms, including fever, intractable vomiting, escalating pain, or any other symptoms concerning to you.

## 2017-11-14 DIAGNOSIS — E785 Hyperlipidemia, unspecified: Secondary | ICD-10-CM | POA: Insufficient documentation

## 2017-11-14 DIAGNOSIS — F17210 Nicotine dependence, cigarettes, uncomplicated: Secondary | ICD-10-CM | POA: Diagnosis not present

## 2017-11-14 DIAGNOSIS — R200 Anesthesia of skin: Secondary | ICD-10-CM | POA: Diagnosis not present

## 2017-11-14 DIAGNOSIS — Z79899 Other long term (current) drug therapy: Secondary | ICD-10-CM | POA: Insufficient documentation

## 2017-11-14 DIAGNOSIS — E119 Type 2 diabetes mellitus without complications: Secondary | ICD-10-CM | POA: Diagnosis not present

## 2017-11-14 DIAGNOSIS — T8149XA Infection following a procedure, other surgical site, initial encounter: Secondary | ICD-10-CM | POA: Insufficient documentation

## 2017-11-14 DIAGNOSIS — I10 Essential (primary) hypertension: Secondary | ICD-10-CM | POA: Insufficient documentation

## 2017-11-14 DIAGNOSIS — T8140XA Infection following a procedure, unspecified, initial encounter: Secondary | ICD-10-CM | POA: Diagnosis present

## 2017-11-14 DIAGNOSIS — Y829 Unspecified medical devices associated with adverse incidents: Secondary | ICD-10-CM | POA: Insufficient documentation

## 2017-11-15 ENCOUNTER — Emergency Department (HOSPITAL_COMMUNITY): Payer: Medicaid Other

## 2017-11-15 ENCOUNTER — Encounter (HOSPITAL_COMMUNITY): Payer: Self-pay | Admitting: *Deleted

## 2017-11-15 ENCOUNTER — Other Ambulatory Visit: Payer: Self-pay

## 2017-11-15 ENCOUNTER — Emergency Department (HOSPITAL_COMMUNITY)
Admission: EM | Admit: 2017-11-15 | Discharge: 2017-11-15 | Disposition: A | Discharge status: Home / Self Care | Payer: Medicaid Other | Attending: Emergency Medicine | Admitting: Emergency Medicine

## 2017-11-15 DIAGNOSIS — T8149XA Infection following a procedure, other surgical site, initial encounter: Secondary | ICD-10-CM

## 2017-11-15 LAB — BASIC METABOLIC PANEL
Anion gap: 12 (ref 5–15)
BUN: 13 mg/dL (ref 6–20)
CO2: 24 mmol/L (ref 22–32)
Calcium: 9.2 mg/dL (ref 8.9–10.3)
Chloride: 108 mmol/L (ref 101–111)
Creatinine, Ser: 0.75 mg/dL (ref 0.44–1.00)
GFR calc Af Amer: >60 mL/min (ref >60)
GFR calc non Af Amer: >60 mL/min (ref >60)
GLUCOSE: 130 mg/dL — AB (ref 65–99)
POTASSIUM: 3.7 mmol/L (ref 3.5–5.1)
Sodium: 144 mmol/L (ref 135–145)

## 2017-11-15 LAB — CBC WITH DIFFERENTIAL/PLATELET
Basophils Absolute: 0 10*3/uL (ref 0.0–0.1)
Basophils Relative: 0 %
EOS PCT: 1 %
Eosinophils Absolute: 0.1 10*3/uL (ref 0.0–0.7)
HCT: 37.3 % (ref 36.0–46.0)
Hemoglobin: 12.6 g/dL (ref 12.0–15.0)
LYMPHS ABS: 2.6 10*3/uL (ref 0.7–4.0)
LYMPHS PCT: 36 %
MCH: 32.4 pg (ref 26.0–34.0)
MCHC: 33.8 g/dL (ref 30.0–36.0)
MCV: 95.9 fL (ref 78.0–100.0)
MONO ABS: 0.6 10*3/uL (ref 0.1–1.0)
Monocytes Relative: 8 %
Neutro Abs: 4 10*3/uL (ref 1.7–7.7)
Neutrophils Relative %: 55 %
PLATELETS: 189 10*3/uL (ref 150–400)
RBC: 3.89 MIL/uL (ref 3.87–5.11)
RDW: 12.1 % (ref 11.5–15.5)
WBC: 7.3 10*3/uL (ref 4.0–10.5)

## 2017-11-15 LAB — C-REACTIVE PROTEIN: CRP: <0.8 mg/dL (ref <1.0)

## 2017-11-15 LAB — SEDIMENTATION RATE: Sed Rate: 45 mm/hr — ABNORMAL HIGH (ref 0–22)

## 2017-11-15 MED ORDER — GADOBENATE DIMEGLUMINE 529 MG/ML IV SOLN
20.0000 mL | Freq: Once | INTRAVENOUS | Status: AC | PRN
  Administered 2017-11-15: 20 mL via INTRAVENOUS

## 2017-11-15 MED ORDER — KETOROLAC TROMETHAMINE 30 MG/ML IJ SOLN
30.0000 mg | Freq: Once | INTRAMUSCULAR | Status: AC
  Administered 2017-11-15: 30 mg via INTRAVENOUS
  Filled 2017-11-15: qty 1

## 2017-11-15 MED ORDER — VANCOMYCIN HCL IN DEXTROSE 1-5 GM/200ML-% IV SOLN
1000.0000 mg | Freq: Once | INTRAVENOUS | Status: AC
  Administered 2017-11-15: 1000 mg via INTRAVENOUS
  Filled 2017-11-15: qty 200

## 2017-11-15 MED ORDER — CLINDAMYCIN HCL 150 MG PO CAPS: ORAL_CAPSULE | ORAL | 0 refills | Status: AC

## 2017-11-15 MED ORDER — KETOROLAC TROMETHAMINE 30 MG/ML IJ SOLN
60.0000 mg | Freq: Once | INTRAMUSCULAR | Status: AC
  Administered 2017-11-15: 60 mg via INTRAMUSCULAR
  Filled 2017-11-15: qty 2

## 2017-11-15 MED ORDER — CLINDAMYCIN PHOSPHATE 600 MG/50ML IV SOLN
600.0000 mg | Freq: Once | INTRAVENOUS | Status: AC
  Administered 2017-11-15: 600 mg via INTRAVENOUS
  Filled 2017-11-15: qty 50

## 2017-11-15 NOTE — ED Triage Notes (Signed)
Pt c/o continued pain to back surgery incision; pt states she had surgery on November 5 and continues to have pain and green drainage from site

## 2017-11-15 NOTE — ED Notes (Signed)
Pt ambulated to bathroom with no difficulty 

## 2017-11-15 NOTE — ED Notes (Signed)
Pt arrive back from MRI

## 2017-11-15 NOTE — Discharge Instructions (Signed)
Take the prescription as directed.  Wash the area gently with soap and water, and pat dry, at least twice a day, and cover with a clean/dry dressing.  Change the dressing whenever it becomes wet or soiled after washing the area with soap and water and patting dry.  Call your Neurosurgeon today to schedule a follow up appointment within the next 2 days.  Return to the Emergency Department immediately if worsening.

## 2017-11-15 NOTE — ED Provider Notes (Signed)
Pt received at sign out with MRI pending. Pt has been to ED several times for possible wound infection s/p laminectomy 10/02/17 and continues to c/o LBP with left leg "numbness" pre/post surgery.  WBC count normal. Pt afebrile. MRI as below. Pt has ambulated with steady gait, NAD.  0955:  T/C to Neurosurgery APP for Dr. Bevely Palmeritty, case discussed, including:  HPI, pertinent PM/SHx, VS/PE, dx testing, ED course and treatment: has viewed the MRI and wound pics in Epic, states pt has been no show for the past 3 appointments and was actually supposed to be seen in office today, requests to rx clindamycin and please reiterate to pt to f/u in office. Dx and testing, as well as d/w Neurosurgery, d/w pt.  Questions answered.  Verb understanding, agreeable to d/c home with outpt f/u.  Pt strongly encouraged to f/u with her Neurosurgeon for good continuity of care.  Pt verb understanding.     Results for orders placed or performed during the hospital encounter of 11/15/17  Basic metabolic panel  Result Value Ref Range   Sodium 144 135 - 145 mmol/L   Potassium 3.7 3.5 - 5.1 mmol/L   Chloride 108 101 - 111 mmol/L   CO2 24 22 - 32 mmol/L   Glucose, Bld 130 (H) 65 - 99 mg/dL   BUN 13 6 - 20 mg/dL   Creatinine, Ser 1.610.75 0.44 - 1.00 mg/dL   Calcium 9.2 8.9 - 09.610.3 mg/dL   GFR calc non Af Amer >60 >60 mL/min   GFR calc Af Amer >60 >60 mL/min   Anion gap 12 5 - 15  CBC with Differential  Result Value Ref Range   WBC 7.3 4.0 - 10.5 K/uL   RBC 3.89 3.87 - 5.11 MIL/uL   Hemoglobin 12.6 12.0 - 15.0 g/dL   HCT 04.537.3 40.936.0 - 81.146.0 %   MCV 95.9 78.0 - 100.0 fL   MCH 32.4 26.0 - 34.0 pg   MCHC 33.8 30.0 - 36.0 g/dL   RDW 91.412.1 78.211.5 - 95.615.5 %   Platelets 189 150 - 400 K/uL   Neutrophils Relative % 55 %   Neutro Abs 4.0 1.7 - 7.7 K/uL   Lymphocytes Relative 36 %   Lymphs Abs 2.6 0.7 - 4.0 K/uL   Monocytes Relative 8 %   Monocytes Absolute 0.6 0.1 - 1.0 K/uL   Eosinophils Relative 1 %   Eosinophils Absolute 0.1 0.0 - 0.7  K/uL   Basophils Relative 0 %   Basophils Absolute 0.0 0.0 - 0.1 K/uL  Sedimentation rate  Result Value Ref Range   Sed Rate 45 (H) 0 - 22 mm/hr   Mr Lumbar Spine W Wo Contrast Result Date: 11/15/2017 CLINICAL DATA:  Low back pain after lumbar surgery November 2018 EXAM: MRI LUMBAR SPINE WITHOUT AND WITH CONTRAST TECHNIQUE: Multiplanar and multiecho pulse sequences of the lumbar spine were obtained without and with intravenous contrast. CONTRAST:  20mL MULTIHANCE GADOBENATE DIMEGLUMINE 529 MG/ML IV SOLN COMPARISON:  08/31/2017 FINDINGS: Segmentation:  Standard. Alignment:  Physiologic. Vertebrae:  No fracture, evidence of discitis, or bone lesion. Conus medullaris and cauda equina: Conus extends to the T12 level. Conus and cauda equina appear normal. Paraspinal and other soft tissues: Recent surgery with subcutaneous incisional fluid collection measuring up to 2.2 cm. There is also fluid within the left laminotomy defect at L4-5. These collections do not cause mass effect on the thecal sac. Disc levels: Lower lumbar canal appears congenitally stenotic. T11-12: Right paracentral disc protrusion contacting the  ventral cord. The ligamentum flavum is mildly thickened at this level. T12- L1: Unremarkable. L1-L2: Unremarkable. L2-L3: Unremarkable. L3-L4: Shallow central disc protrusion.  No impingement L4-L5: Nonenhancing inferiorly migrating disc extrusion that is nucleus intensity. The herniation is smaller than on preoperative MRI. Still, there is advanced spinal stenosis with cauda equina compression. Asymmetric inferior extension towards the left with asymmetric left L5 and S1 impingement. Enhancement around the herniation is attributed to ventral epidural plexus expansion. Left laminotomy as above. The disc is narrowed there is mild endplate degeneration. Patent foramina. L5-S1:Central disc protrusion that is noncompressive. IMPRESSION: 1. 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 Asymmetric leftward inferior migration compressing the descending left L5 and S1 nerve roots. The herniation is smaller than on 08/31/2017 exam. 2. Small incisional fluid collections in the subcutaneous fat and left laminotomy. No associated impingement. 3. Stable disc protrusions at T11-12, L3-4, and L5-S1. Electronically Signed   By: Marnee SpringJonathon  Watts M.D.   On: 11/15/2017 08:14      Samuel JesterMcManus, Cortlandt Capuano, DO 11/15/17 1005

## 2017-11-15 NOTE — ED Provider Notes (Signed)
[facility]   CSN: [id number] Arrival date & time: 11/14/17  2333  Time seen 01:00 AM  History   Chief Complaint Chief Complaint  Patient presents with  . Post-op Problem    HPI Caitlin Mcguire is a 36 y.o. female.  HPI patient had surgery for a L4/5 herniated disc on November 5 by Dr. Bevely Palmeritty, neurosurgeon.  She states she had numbness in her left leg before surgery which has not improved.  She states everything was fine.  She states she kept her 3-week appointment which was November 26.  She [place] [facility] [place] on [place] 5 who thought her wound was infected.  She said the neurosurgeon called her in Keflex for 1 week however she actually came to this ED on December 6 and was evaluated and started on Keflex.  She states after the Keflex was gone she felt like it started getting more painful and started draining 2-3 days ago.  She [place] [facility] [place] today and she felt like the wound was getting infected again.  She states her next follow-up with Dr. Bevely Palmeritty was December 27 however she called the on-call doctor tonight and they said to come to the office tomorrow at 2:30.  She states however she does not have transportation to keep that appointment.  She states she has difficulty with wound care because the area is hard for her to reach, she states her teenage son does not feel comfortable doing wound care.  PCP Laurice RecordAgbafe-Mosley, Dorothy, [place]   Past Medical History:  Diagnosis Date  . Anxiety   . Diabetes mellitus without complication (HCC)   . Fibromyalgia   . GERD (gastroesophageal reflux disease)   . Headache   . History of bronchitis   . History of kidney stones    age 36  . Hyperlipidemia   . Hypertension     Patient Active Problem List   Diagnosis Date Noted  . 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 BACK PAIN 01/17/2008     Past Surgical History:  Procedure Laterality Date  . LUMBAR LAMINECTOMY/DECOMPRESSION MICRODISCECTOMY Left 10/02/2017   Procedure: LUMBAR FOUR-FIVE MICRODISCECTOMY;  Surgeon: Ditty, Loura HaltBenjamin Jared, MD;  [facility];  Service: Neurosurgery;  Laterality: Left;  . WISDOM TOOTH EXTRACTION      OB History    Gravida Para Term Preterm AB Living   7       3 2    SAB TAB Ectopic Multiple Live Births   3               Home Medications    Prior to Admission medications   Medication Sig Start Date End Date Taking? Authorizing Provider  ALPRAZolam Prudy Feeler(XANAX) 1 MG tablet Take 1 tablet (1 mg total) 3 (three) times daily as needed by mouth for anxiety. 10/04/17   Ditty, Loura HaltBenjamin Jared, MD  amphetamine-dextroamphetamine (ADDERALL) 30 MG tablet Take 1 tablet 3 (three) times daily by mouth. 10/04/17   Ditty, Loura HaltBenjamin Jared, MD  butalbital-acetaminophen-caffeine (FIORICET, ESGIC) [phone] MG tablet Take 1 tablet by mouth every 6 (six) hours as needed for headache.     [provider]  gabapentin (NEURONTIN) 300 MG capsule Take 900 mg by mouth 3 (three) times daily.    [provider]  gabapentin (NEURONTIN) 400 MG capsule Take 2 capsules (800 mg total) at bedtime by mouth. Patient not taking: Reported on  11/02/2017 10/04/17   Costella, Darci CurrentVincent J, PA-C  glipiZIDE (GLUCOTROL XL) 10 MG 24 hr tablet Take 10 mg by mouth daily with breakfast.    [provider]  ibuprofen (ADVIL,MOTRIN) 800 MG tablet Take 800 mg by mouth every 8 (eight) hours as needed for mild pain or moderate pain.    [provider]  lisinopril-hydrochlorothiazide (PRINZIDE,ZESTORETIC) 10-12.5 MG tablet Take 1 tablet by mouth daily.    [provider]  metoprolol succinate (TOPROL-XL) 25 MG 24 hr tablet Take 25 mg by mouth at bedtime.     [provider]  oxyCODONE (ROXICODONE) 15 MG immediate release tablet TAKE 1-2  BY MOUTH NIGHTLY  AS NEEDED FOR BREAK THROUGH PAIN 10/16/17    [provider]  oxyCODONE-acetaminophen (PERCOCET) 10-325 MG tablet Take 1 tablet every 4 (four) hours as needed by mouth for pain. 10/04/17   Costella, Darci CurrentVincent J, PA-C  pravastatin (PRAVACHOL) 20 MG tablet Take 20 mg by mouth at bedtime.     [provider]  tiZANidine (ZANAFLEX) 4 MG tablet Take 4 mg by mouth every 8 (eight) hours as needed for muscle spasms.     [provider]    Family History History reviewed. No pertinent family history.  Social History Social History   Tobacco Use  . Smoking status: Current Every Day Smoker    Packs/day: 0.50    Types: Cigarettes  . Smokeless tobacco: Never Used  Substance Use Topics  . Alcohol use: No  . Drug use: No     Allergies   Penicillins   Review of Systems Review of Systems  All other systems reviewed and are negative.    Physical Exam Updated Vital Signs BP 107/70 (BP Location: Left Arm)   Pulse 83   Temp 98.4 F (36.9 C) (Oral)   Resp 20   Ht 5\' 3"  (1.6 m)   Wt 108.9 kg (240 lb)   LMP 10/21/2017   SpO2 97%   BMI 42.51 kg/m   Vital signs normal    Physical Exam  Constitutional: She appears well-developed and well-nourished. She appears distressed.  HENT:  Head: Normocephalic and atraumatic.  Right Ear: External ear normal.  Left Ear: External ear normal.  Nose: Nose normal.  Eyes: Conjunctivae and EOM are normal.  Neck: Normal range of motion.  Cardiovascular: Normal rate.  Pulmonary/Chest: Effort normal. No respiratory distress.  Musculoskeletal: She exhibits tenderness.  Patient is laying on her left side, crying.  When I examined her wound in her lumbar area center of the wound has some dried drainage without active leaking.  There was thin area of redness extending around the incision.  She is tender diffusely everywhere that I palpate her lower back.  Skin: Skin is warm and dry.  Psychiatric:  tearful  Nursing note and vitals reviewed.  Photo today     Photo  from ED visit on Dec 6      ED Treatments / Results  Labs (all labs ordered are listed, but only abnormal results are displayed) Results for orders placed or performed during the hospital encounter of 11/15/17  Basic metabolic panel  Result Value Ref Range   Sodium 144 135 - 145 mmol/L   Potassium 3.7 3.5 - 5.1 mmol/L   Chloride 108 101 - 111 mmol/L   CO2 24 22 - 32 mmol/L   Glucose, Bld 130 (H) 65 - 99 mg/dL   BUN 13 6 - 20 mg/dL   Creatinine, Ser 0.980.75 0.44 - 1.00 mg/dL  Calcium 9.2 8.9 - 10.3 mg/dL   GFR calc non Af Amer >60 >60 mL/min   GFR calc Af Amer >60 >60 mL/min   Anion gap 12 5 - 15  CBC with Differential  Result Value Ref Range   WBC 7.3 4.0 - 10.5 K/uL   RBC 3.89 3.87 - 5.11 MIL/uL   Hemoglobin 12.6 12.0 - 15.0 g/dL   HCT 40.9 81.1 - 91.4 %   MCV 95.9 78.0 - 100.0 fL   MCH 32.4 26.0 - 34.0 pg   MCHC 33.8 30.0 - 36.0 g/dL   RDW 78.2 95.6 - 21.3 %   Platelets 189 150 - 400 K/uL   Neutrophils Relative % 55 %   Neutro Abs 4.0 1.7 - 7.7 K/uL   Lymphocytes Relative 36 %   Lymphs Abs 2.6 0.7 - 4.0 K/uL   Monocytes Relative 8 %   Monocytes Absolute 0.6 0.1 - 1.0 K/uL   Eosinophils Relative 1 %   Eosinophils Absolute 0.1 0.0 - 0.7 K/uL   Basophils Relative 0 %   Basophils Absolute 0.0 0.0 - 0.1 K/uL  Sedimentation rate  Result Value Ref Range   Sed Rate 45 (H) 0 - 22 mm/hr   Laboratory interpretation all normal except mildly elevated sed rate, CRP pending    EKG  EKG Interpretation None       Radiology No results found.  Procedures Procedures (including critical care time)  Medications Ordered in ED Medications  ketorolac (TORADOL) 30 MG/ML injection 60 mg (60 mg Intramuscular Given 11/15/17 0122)  clindamycin (CLEOCIN) IVPB 600 mg (0 mg Intravenous Stopped 11/15/17 0509)  vancomycin (VANCOCIN) IVPB 1000 mg/200 mL premix (0 mg Intravenous Stopped 11/15/17 0509)  ketorolac (TORADOL) 30 MG/ML injection 30 mg (30 mg Intravenous Given 11/15/17  0721)  gadobenate dimeglumine (MULTIHANCE) injection 20 mL (20 mLs Intravenous Contrast Given 11/15/17 0738)     Initial Impression / Assessment and Plan / ED Course  I have reviewed the triage vital signs and the nursing notes.  Pertinent labs & imaging results that were available during my care of the patient were reviewed by me and considered in my medical decision making (see chart for details).      Patient was given Toradol for pain, I looked at her blood work from December 6 and her BUN and creatinine were normal..  She has plenty of narcotic pain pills she can take at home.  Laboratory testing was done.  Patient was discussed with Dr. Wynetta Emery, neurosurgeon at 3:17 AM when I needed to speak to him about another patient.  He recommends the patient call the office and be seen earlier this morning however I pointed out to him patient has no transportation so she will not make any office visits today.  We are going to keep her in the ED and do an MRI in the morning when they come into the hospital, and if her MRI does not show any significant deep infection she can be discharged home with doxycycline.  While waiting for her MRI scan she was given IV clindamycin and vancomycin, patient has a penicillin allergy.  She was informed of the plan and she is agreeable.  Patient requesting more pain medication before she goes MRI, she was given a second dose of Toradol, he had been 6 hours since her last dose.  08:00 AM patient turned over to Dr Clarene Duke at change of shift to get results of her MRI, if the infection is superficial, she can be  discharged home on doxycycline and to call Dr Ditty's office to get a home health nurse to help her with wound care. If there is a deeper infection, she will need to be discussed with Dr Bevely Palmer.  She already has a follow-up appointment on the 27th.  Review of the [place] shows patient has an overdose risk score of 810.  She has gotten multiple  narcotic prescriptions and dextroamphetamine and alprazolam.  She last received #100 oxycodone 10/325 and #50 oxycodone 15 mg tablets on December 13, she received #90 dextroamphetamine/amphetamine 30 mg tablets on December 8, and #90 alprazolam 1 mg tablets on December 6.  These are all written by her PCP doctor who also is licensed to prescribe Suboxone.    Final Clinical Impressions(s) / ED Diagnoses   Final diagnoses:  Wound infection after surgery     Disposition pending MRI results.  Devoria Albe, MD, Concha Pyo, MD 11/15/17 [phone]
